# Patient Record
Sex: Female | Born: 1965 | Hispanic: No | Marital: Married | Smoking: Current every day smoker
Health system: Southern US, Community
[De-identification: ages and names within clinical notes are randomized; demographics above are authoritative.]

## PROBLEM LIST (undated history)

## (undated) ENCOUNTER — Emergency Department

## (undated) DIAGNOSIS — I10 Essential (primary) hypertension: Secondary | ICD-10-CM

## (undated) DIAGNOSIS — M069 Rheumatoid arthritis, unspecified: Secondary | ICD-10-CM

## (undated) DIAGNOSIS — K219 Gastro-esophageal reflux disease without esophagitis: Secondary | ICD-10-CM

## (undated) DIAGNOSIS — J449 Chronic obstructive pulmonary disease, unspecified: Secondary | ICD-10-CM

## (undated) DIAGNOSIS — M797 Fibromyalgia: Secondary | ICD-10-CM

## (undated) DIAGNOSIS — F32A Depression, unspecified: Secondary | ICD-10-CM

## (undated) DIAGNOSIS — F419 Anxiety disorder, unspecified: Secondary | ICD-10-CM

## (undated) DIAGNOSIS — B029 Zoster without complications: Secondary | ICD-10-CM

## (undated) DIAGNOSIS — C801 Malignant (primary) neoplasm, unspecified: Secondary | ICD-10-CM

## (undated) HISTORY — DX: Essential (primary) hypertension: I10

## (undated) HISTORY — PX: CHOLECYSTECTOMY: SHX55

## (undated) HISTORY — PX: NASAL SINUS SURGERY: SHX719

## (undated) HISTORY — PX: ADRENALECTOMY: SHX876

## (undated) HISTORY — PX: TUBAL LIGATION: SHX77

## (undated) HISTORY — DX: Zoster without complications: B02.9

## (undated) HISTORY — PX: APPENDECTOMY: SHX54

## (undated) HISTORY — DX: Chronic obstructive pulmonary disease, unspecified: J44.9

## (undated) HISTORY — DX: Anxiety disorder, unspecified: F41.9

## (undated) HISTORY — DX: Gastro-esophageal reflux disease without esophagitis: K21.9

## (undated) HISTORY — DX: Depression, unspecified: F32.A

## (undated) HISTORY — PX: NEPHRECTOMY RADICAL: SUR878

---

## 2004-02-02 ENCOUNTER — Inpatient Hospital Stay (HOSPITAL_COMMUNITY): Admission: AD | Admit: 2004-02-02 | Discharge: 2004-02-06 | Payer: Self-pay | Admitting: Psychiatry

## 2012-10-26 DIAGNOSIS — R630 Anorexia: Secondary | ICD-10-CM | POA: Insufficient documentation

## 2012-10-26 DIAGNOSIS — F32A Depression, unspecified: Secondary | ICD-10-CM | POA: Insufficient documentation

## 2013-06-09 DIAGNOSIS — M25519 Pain in unspecified shoulder: Secondary | ICD-10-CM | POA: Insufficient documentation

## 2013-12-25 DIAGNOSIS — R1013 Epigastric pain: Secondary | ICD-10-CM | POA: Insufficient documentation

## 2013-12-25 DIAGNOSIS — C649 Malignant neoplasm of unspecified kidney, except renal pelvis: Secondary | ICD-10-CM | POA: Insufficient documentation

## 2013-12-25 DIAGNOSIS — F1120 Opioid dependence, uncomplicated: Secondary | ICD-10-CM | POA: Insufficient documentation

## 2017-06-15 DIAGNOSIS — M064 Inflammatory polyarthropathy: Secondary | ICD-10-CM | POA: Insufficient documentation

## 2017-06-15 DIAGNOSIS — M79671 Pain in right foot: Secondary | ICD-10-CM | POA: Insufficient documentation

## 2017-06-15 DIAGNOSIS — R760 Raised antibody titer: Secondary | ICD-10-CM | POA: Insufficient documentation

## 2017-06-15 DIAGNOSIS — Z111 Encounter for screening for respiratory tuberculosis: Secondary | ICD-10-CM

## 2017-06-15 HISTORY — DX: Encounter for screening for respiratory tuberculosis: Z11.1

## 2017-07-15 DIAGNOSIS — M059 Rheumatoid arthritis with rheumatoid factor, unspecified: Secondary | ICD-10-CM | POA: Insufficient documentation

## 2017-10-21 DIAGNOSIS — Z79891 Long term (current) use of opiate analgesic: Secondary | ICD-10-CM | POA: Insufficient documentation

## 2021-05-01 ENCOUNTER — Encounter: Payer: Self-pay | Admitting: Emergency Medicine

## 2021-05-01 ENCOUNTER — Emergency Department: Payer: Medicare Other

## 2021-05-01 ENCOUNTER — Emergency Department
Admission: EM | Admit: 2021-05-01 | Discharge: 2021-05-01 | Disposition: A | Discharge status: Home / Self Care | Payer: Medicare Other | Attending: Emergency Medicine | Admitting: Emergency Medicine

## 2021-05-01 ENCOUNTER — Other Ambulatory Visit: Payer: Self-pay

## 2021-05-01 DIAGNOSIS — Z85528 Personal history of other malignant neoplasm of kidney: Secondary | ICD-10-CM | POA: Insufficient documentation

## 2021-05-01 DIAGNOSIS — F17210 Nicotine dependence, cigarettes, uncomplicated: Secondary | ICD-10-CM | POA: Insufficient documentation

## 2021-05-01 DIAGNOSIS — C801 Malignant (primary) neoplasm, unspecified: Secondary | ICD-10-CM | POA: Insufficient documentation

## 2021-05-01 DIAGNOSIS — R0981 Nasal congestion: Secondary | ICD-10-CM | POA: Insufficient documentation

## 2021-05-01 DIAGNOSIS — R059 Cough, unspecified: Secondary | ICD-10-CM | POA: Diagnosis present

## 2021-05-01 DIAGNOSIS — R079 Chest pain, unspecified: Secondary | ICD-10-CM

## 2021-05-01 DIAGNOSIS — J40 Bronchitis, not specified as acute or chronic: Secondary | ICD-10-CM | POA: Diagnosis not present

## 2021-05-01 HISTORY — DX: Fibromyalgia: M79.7

## 2021-05-01 HISTORY — DX: Malignant (primary) neoplasm, unspecified: C80.1

## 2021-05-01 HISTORY — DX: Rheumatoid arthritis, unspecified: M06.9

## 2021-05-01 LAB — TROPONIN I (HIGH SENSITIVITY)
Troponin I (High Sensitivity): <2 ng/L (ref <18)
Troponin I (High Sensitivity): <2 ng/L (ref <18)

## 2021-05-01 LAB — BASIC METABOLIC PANEL
Anion gap: 7 (ref 5–15)
BUN: 9 mg/dL (ref 6–20)
CO2: 23 mmol/L (ref 22–32)
Calcium: 9.2 mg/dL (ref 8.9–10.3)
Chloride: 104 mmol/L (ref 98–111)
Creatinine, Ser: 0.61 mg/dL (ref 0.44–1.00)
GFR, Estimated: >60 mL/min (ref >60)
Glucose, Bld: 102 mg/dL — ABNORMAL HIGH (ref 70–99)
Potassium: 3.8 mmol/L (ref 3.5–5.1)
Sodium: 134 mmol/L — ABNORMAL LOW (ref 135–145)

## 2021-05-01 LAB — CBC
HCT: 37.5 % (ref 36.0–46.0)
Hemoglobin: 13.1 g/dL (ref 12.0–15.0)
MCH: 31.9 pg (ref 26.0–34.0)
MCHC: 34.9 g/dL (ref 30.0–36.0)
MCV: 91.2 fL (ref 80.0–100.0)
Platelets: 210 10*3/uL (ref 150–400)
RBC: 4.11 MIL/uL (ref 3.87–5.11)
RDW: 12.4 % (ref 11.5–15.5)
WBC: 8.4 10*3/uL (ref 4.0–10.5)
nRBC: 0 % (ref 0.0–0.2)

## 2021-05-01 MED ORDER — AZITHROMYCIN 250 MG PO TABS: ORAL_TABLET | ORAL | 0 refills | Status: AC

## 2021-05-01 NOTE — ED Triage Notes (Signed)
Pt c/o cough with sinus congestion for the past 2 months and since Monday having left sided chest pain that radiates into the left shoulder blade Pt is in NAD on arrival, ambulatory with a steady gait

## 2021-05-01 NOTE — ED Provider Notes (Signed)
[facility] Emergency Department Provider Note  Time seen: 3:14 PM  I have reviewed the triage vital signs and the nursing notes.   HISTORY  Chief Complaint Chest Pain and Cough   HPI Veronica Rangel is a 55 y.o. female with a past medical history of fibromyalgia, rheumatoid arthritis, presents emergency department for chest pain.  According to the patient for the past month or so she has been experiencing frequent cough and sinus congestion.  Patient states a history of sinus congestion including 2 prior sinus surgeries, states this is fairly chronic for her.  Patient states she has been coughing but has been experiencing intermittent chest pain over the past 1 week in the left chest.  Patient called EMS yesterday but states the normal EKG so she decided not to come to the emergency department.  States the chest pain occurred again today so she came to the emergency department for evaluation.  Patient states the pain is mostly when she is moving her left arm or if she coughs or presses on the area.  Denies any fever.  Denies any nausea or diaphoresis.  No leg pain or swelling.   Past Medical History:  Diagnosis Date   Cancer ([place])    renal   Fibromyalgia    RA (rheumatoid arthritis) ([place])     Patient Active Problem List   Diagnosis Date Noted   Cancer ([place]) 05/01/2021    Past Surgical History:  Procedure Laterality Date   ADRENALECTOMY Left    APPENDECTOMY     CESAREAN SECTION     CHOLECYSTECTOMY     NEPHRECTOMY RADICAL      Prior to Admission medications   Not on File    Allergies  Allergen Reactions   Codeine Anaphylaxis    No family history on file.  Social History Social History   Tobacco Use   Smoking status: Every Day    Pack years: 0.00    Types: Cigarettes   Smokeless tobacco: Never  Substance Use Topics   Alcohol use: Not Currently   Drug use: Not Currently    Review of Systems Constitutional: Negative for  fever. Cardiovascular: Left chest pain intermittent x1.5 weeks. Respiratory: Negative for shortness of breath.  Cough x1 month. Gastrointestinal: Negative for abdominal pain, vomiting  Musculoskeletal: Left chest wall pain. Neurological: Negative for headache All other ROS negative  ____________________________________________   PHYSICAL EXAM:  VITAL SIGNS: ED Triage Vitals  Enc Vitals Group     BP 05/01/21 1224 (!) 143/70     Pulse Rate 05/01/21 1224 80     Resp 05/01/21 1224 17     Temp 05/01/21 1224 97.7 F (36.5 C)     Temp Source 05/01/21 1224 Oral     SpO2 05/01/21 1224 100 %     Weight 05/01/21 1225 130 lb (59 kg)     Height 05/01/21 1225 5\' 4"  (1.626 m)     Head Circumference --      Peak Flow --      Pain Score 05/01/21 1225 7     Pain Loc --      Pain Edu? --      Excl. in [place]? --    Constitutional: Alert and oriented. Well appearing and in no distress. Eyes: Normal exam ENT      Head: Normocephalic and atraumatic.      Mouth/Throat: Mucous membranes are moist. Cardiovascular: Normal rate, regular rhythm.  Respiratory: Normal respiratory effort without tachypnea nor retractions. Breath sounds  are clear.  Occasional cough.  Moderate left chest tenderness to palpation. Gastrointestinal: Soft and nontender. No distention.   Musculoskeletal: Nontender with normal range of motion in all extremities. No lower extremity tenderness or edema. Neurologic:  Normal speech and language. No gross focal neurologic deficit Skin:  Skin is warm, dry and intact.  Psychiatric: Mood and affect are normal.   ____________________________________________    EKG  EKG viewed and interpreted by myself shows a normal sinus rhythm 84 bpm with a narrow QRS, normal axis, normal intervals, no concerning ST changes.  ____________________________________________    RADIOLOGY  Chest x-ray is negative for acute abnormality.  ____________________________________________   INITIAL  IMPRESSION / ASSESSMENT AND PLAN / ED COURSE  Pertinent labs & imaging results that were available during my care of the patient were reviewed by me and considered in my medical decision making (see chart for details).   Patient presents emergency department for 1 month of cough and congestion now with 1 week of intermittent left chest pain.  Patient has reproducible chest pain with palpation to the left chest.  Lab work is reassuring including negative troponin x2.  Chest x-ray shows no acute abnormality.  Given the patient's symptoms of cough and congestion I did recommend a COVID test which the patient has declined.  Suspect likely bronchitis leading to chest wall/musculoskeletal discomfort.  We will cover with Zithromax as a precaution have the patient follow-up with her doctor.  Discussed my typical chest pain return precautions.  Veronica Rangel was evaluated in Emergency Department on 05/01/2021 for the symptoms described in the history of present illness. She was evaluated in the context of the global COVID-19 pandemic, which necessitated consideration that the patient might be at risk for infection with the SARS-CoV-2 virus that causes COVID-19. Institutional protocols and algorithms that pertain to the evaluation of patients at risk for COVID-19 are in a state of rapid change based on information released by regulatory bodies including the CDC and federal and state organizations. These policies and algorithms were followed during the patient's care in the ED.  ____________________________________________   FINAL CLINICAL IMPRESSION(S) / ED DIAGNOSES  Chest pain Bronchitis   Harvest Dark, MD 05/01/21 1517

## 2022-03-08 ENCOUNTER — Other Ambulatory Visit: Payer: Self-pay

## 2022-03-08 ENCOUNTER — Encounter: Payer: Self-pay | Admitting: *Deleted

## 2022-03-08 ENCOUNTER — Emergency Department: Payer: Medicare Other

## 2022-03-08 DIAGNOSIS — F419 Anxiety disorder, unspecified: Secondary | ICD-10-CM | POA: Diagnosis not present

## 2022-03-08 DIAGNOSIS — J9801 Acute bronchospasm: Secondary | ICD-10-CM | POA: Insufficient documentation

## 2022-03-08 DIAGNOSIS — Z720 Tobacco use: Secondary | ICD-10-CM | POA: Insufficient documentation

## 2022-03-08 DIAGNOSIS — Z85528 Personal history of other malignant neoplasm of kidney: Secondary | ICD-10-CM | POA: Insufficient documentation

## 2022-03-08 DIAGNOSIS — R0602 Shortness of breath: Secondary | ICD-10-CM | POA: Diagnosis present

## 2022-03-08 LAB — BASIC METABOLIC PANEL
Anion gap: 7 (ref 5–15)
BUN: 16 mg/dL (ref 6–20)
CO2: 25 mmol/L (ref 22–32)
Calcium: 8.8 mg/dL — ABNORMAL LOW (ref 8.9–10.3)
Chloride: 108 mmol/L (ref 98–111)
Creatinine, Ser: 0.63 mg/dL (ref 0.44–1.00)
GFR, Estimated: >60 mL/min (ref >60)
Glucose, Bld: 117 mg/dL — ABNORMAL HIGH (ref 70–99)
Potassium: 4 mmol/L (ref 3.5–5.1)
Sodium: 140 mmol/L (ref 135–145)

## 2022-03-08 LAB — CBC
HCT: 39.3 % (ref 36.0–46.0)
Hemoglobin: 12.7 g/dL (ref 12.0–15.0)
MCH: 28.2 pg (ref 26.0–34.0)
MCHC: 32.3 g/dL (ref 30.0–36.0)
MCV: 87.3 fL (ref 80.0–100.0)
Platelets: 273 10*3/uL (ref 150–400)
RBC: 4.5 MIL/uL (ref 3.87–5.11)
RDW: 12.3 % (ref 11.5–15.5)
WBC: 8 10*3/uL (ref 4.0–10.5)
nRBC: 0 % (ref 0.0–0.2)

## 2022-03-08 LAB — TROPONIN I (HIGH SENSITIVITY): Troponin I (High Sensitivity): 4 ng/L (ref <18)

## 2022-03-08 NOTE — ED Triage Notes (Signed)
Pt reports feeling sob, tightness in chest, wheezing and panic attacks for 1 week.  Pt states I can't relax.  Taking meds without relief for panic attacks.  Pt alert  speech clear ?

## 2022-03-09 ENCOUNTER — Emergency Department
Admission: EM | Admit: 2022-03-09 | Discharge: 2022-03-09 | Disposition: A | Discharge status: Home / Self Care | Payer: Medicare Other | Attending: Emergency Medicine | Admitting: Emergency Medicine

## 2022-03-09 DIAGNOSIS — F419 Anxiety disorder, unspecified: Secondary | ICD-10-CM

## 2022-03-09 DIAGNOSIS — J9801 Acute bronchospasm: Secondary | ICD-10-CM

## 2022-03-09 LAB — TROPONIN I (HIGH SENSITIVITY): Troponin I (High Sensitivity): 5 ng/L (ref <18)

## 2022-03-09 MED ORDER — HYDROXYZINE PAMOATE 25 MG PO CAPS: 25.0000 mg | ORAL_CAPSULE | Freq: Three times a day (TID) | ORAL | 1 refills | Status: DC | PRN

## 2022-03-09 MED ORDER — ALBUTEROL SULFATE HFA 108 (90 BASE) MCG/ACT IN AERS: 2.0000 | INHALATION_SPRAY | RESPIRATORY_TRACT | 1 refills | Status: DC | PRN

## 2022-03-09 MED ORDER — PREDNISONE 10 MG PO TABS: 60.0000 mg | ORAL_TABLET | Freq: Every day | ORAL | 0 refills | Status: AC

## 2022-03-09 NOTE — ED Provider Notes (Signed)
? ?[facility] ?Provider Note ? ? ? Event Date/Time  ? First MD Initiated Contact with Patient 03/09/22 0449   ?  (approximate) ? ? ?History  ? ?Shortness of Breath ? ? ?HPI ? ?Veronica Rangel is a 56 y.o. female with history of fibromyalgia, rheumatoid arthritis, depression and OCD and anxiety who presents to the emergency department with complaints of wheezing and shortness of breath that have been intermittent over the past week.  She states when she starts to wheeze she becomes more anxious and this causes her to have more shortness of breath and chest discomfort.  Dates she was previously on Effexor for her mental illness but was switched to Cymbalta as her psychiatrist thought that this might help with her fibromyalgia pain.  She states she used to be on Klonopin about 2 years ago but is not able to get this medication through her psychiatrist as they are tele visits.  She denies any SI or HI.  No fevers, cough.  No history of PE, DVT, exogenous estrogen use, recent fractures, surgery, trauma, hospitalization, prolonged travel or other immobilization. No lower extremity swelling or pain. No calf tenderness.  States she thinks she has previously taken Vistaril which helped with her anxiety. ? ?History provided by patient. ? ? ? ?Past Medical History:  ?Diagnosis Date  ? Cancer [facility])   ? renal  ? Fibromyalgia   ? RA (rheumatoid arthritis) ([place])   ? ? ?Past Surgical History:  ?Procedure Laterality Date  ? ADRENALECTOMY Left   ? APPENDECTOMY    ? CESAREAN SECTION    ? CHOLECYSTECTOMY    ? NEPHRECTOMY RADICAL    ? ? ?MEDICATIONS:  ?Prior to Admission medications   ?Not on File  ? ? ?Physical Exam  ? ?Triage Vital Signs: ?ED Triage Vitals  ?Enc Vitals Group  ?   BP 03/08/22 2128 (!) 141/88  ?   Pulse Rate 03/08/22 2128 (!) 110  ?   Resp 03/08/22 2128 (!) 24  ?   Temp 03/08/22 2128 97.8 ?F (36.6 ?C)  ?   Temp Source 03/08/22 2128 Oral  ?   SpO2 03/08/22 2128 97 %  ?   Weight 03/08/22 2129 175 lb  (79.4 kg)  ?   Height 03/08/22 2129 '5\' 4"'$  (1.626 m)  ?   Head Circumference --   ?   Peak Flow --   ?   Pain Score 03/08/22 2129 7  ?   Pain Loc --   ?   Pain Edu? --   ?   Excl. in [place]? --   ? ? ?Most recent vital signs: ?Vitals:  ? 03/08/22 2128 03/09/22 0122  ?BP: (!) 141/88 (!) 168/76  ?Pulse: (!) 110 77  ?Resp: (!) 24 18  ?Temp: 97.8 ?F (36.6 ?C) 98 ?F (36.7 ?C)  ?SpO2: 97% 100%  ? ? ?CONSTITUTIONAL: Alert and oriented and responds appropriately to questions. Well-appearing; well-nourished ?HEAD: Normocephalic, atraumatic ?EYES: Conjunctivae clear, pupils appear equal, sclera nonicteric ?ENT: normal nose; moist mucous membranes ?NECK: Supple, normal ROM ?CARD: RRR; S1 and S2 appreciated; no murmurs, no clicks, no rubs, no gallops ?RESP: Normal chest excursion without splinting or tachypnea; breath sounds clear and equal bilaterally; no wheezes, no rhonchi, no rales, no hypoxia or respiratory distress, speaking full sentences ?ABD/GI: Normal bowel sounds; non-distended; soft, non-tender, no rebound, no guarding, no peritoneal signs ?BACK: The back appears normal ?EXT: Normal ROM in all joints; no deformity noted, no edema; no cyanosis, no calf  tenderness or calf swelling. ?SKIN: Normal color for age and race; warm; no rash on exposed skin ?NEURO: Moves all extremities equally, normal speech ?PSYCH: The patient's mood and manner are appropriate.  No SI or HI. ? ? ?ED Results / Procedures / Treatments  ? ?LABS: ?(all labs ordered are listed, but only abnormal results are displayed) ?Labs Reviewed  ?BASIC METABOLIC PANEL - Abnormal; Notable for the following components:  ?    Result Value  ? Glucose, Bld 117 (*)   ? Calcium 8.8 (*)   ? All other components within normal limits  ?CBC  ?TROPONIN I (HIGH SENSITIVITY)  ?TROPONIN I (HIGH SENSITIVITY)  ? ? ? ?EKG: ? EKG Interpretation ? ?Date/Time:  Monday March 08 2022 21:37:05 EDT ?Ventricular Rate:  92 ?PR Interval:  148 ?QRS Duration: 76 ?QT Interval:  350 ?QTC  Calculation: 432 ?R Axis:   25 ?Text Interpretation: Normal sinus rhythm Cannot rule out Anterior infarct , age undetermined Abnormal ECG When compared with ECG of 01-May-2021 12:18, No significant change was found Confirmed by Pryor Curia [phone]) on 03/09/2022 2:35:55 AM ?  ? ?  ? ? ? ?RADIOLOGY: ?My personal review and interpretation of imaging: Chest x-ray clear. ? ?I have personally reviewed all radiology reports.   ?DG Chest 2 View ? ?Result Date: 03/08/2022 ?CLINICAL DATA:  Shortness of breath. EXAM: CHEST - 2 VIEW COMPARISON:  Chest radiograph dated 05/01/2021. FINDINGS: No focal consolidation, pleural effusion or pneumothorax. The cardiac silhouette is within limits. No acute osseous pathology. IMPRESSION: No active cardiopulmonary disease. Electronically Signed   By: Anner Crete M.D.   On: 03/08/2022 22:25   ? ? ?PROCEDURES: ? ?Critical Care performed: No ? ? ? ? ? ?Procedures ? ? ? ?IMPRESSION / MDM / ASSESSMENT AND PLAN / ED COURSE  ?I reviewed the triage vital signs and the nursing notes. ? ? ? ?Patient here with complaints of chest pain, shortness of breath, anxiety, wheezing.  History of tobacco use but no known history of asthma or COPD. ? ? ? ?DIFFERENTIAL DIAGNOSIS (includes but not limited to):   Anxiety, panic attacks, ACS, PE, dissection, COPD, asthma, bronchospasm, CHF, pneumonia ? ? ?PLAN: We will obtain CBC, BMP, troponin x2, chest x-ray.  Lungs currently clear to auscultation with no wheezing.  EKG nonischemic.  No SI or HI.  I do not feel she needs emergent psychiatric evaluation. ? ? ?MEDICATIONS GIVEN IN ED: ?Medications - No data to display ? ? ?ED COURSE: Patient's labs are reassuring.  Normal hemoglobin, electrolytes, troponin x2 negative.  Chest x-ray reviewed by myself and radiologist and shows no infiltrate or edema.  Lungs are clear to auscultation.  Hemodynamically stable.  No SI, HI.  Does not seem to be in distress.  Will discharge with a prescription of Vistaril and have  encouraged her to follow-up with her psychiatrist to see if she would benefit from switching back to Effexor as she feels this helped with her symptoms previously.  Discussed with her if she feels she needs a prescription for benzodiazepines that this would need to be filled by an outpatient provider.  Have given her outpatient resources.  We will also give her prescription for albuterol inhaler and prednisone burst given her intermittent wheezing in the setting of tobacco use.  Have encouraged her to quit smoking. ? ? ?At this time, I do not feel there is any life-threatening condition present. I reviewed all nursing notes, vitals, pertinent previous records.  All lab and urine results,  EKGs, imaging ordered have been independently reviewed and interpreted by myself.  I reviewed all available radiology reports from any imaging ordered this visit.  Based on my assessment, I feel the patient is safe to be discharged home without further emergent workup and can continue workup as an outpatient as needed. Discussed all findings, treatment plan as well as usual and customary return precautions with patient.  They verbalize understanding and are comfortable with this plan.  Outpatient follow-up has been provided as needed.  All questions have been answered. ? ? ? ?CONSULTS: Patient does have some risk factors for ACS including age, obesity but symptoms seem atypical and she has had 2 negative troponins here therefore do not feel admission is required at this time for chest pain rule out. ? ? ?OUTSIDE RECORDS REVIEWED: Reviewed patient's last office visit with Darrick Huntsman on 05/24/2017. ? ? ? ? ? ? ? ? ?FINAL CLINICAL IMPRESSION(S) / ED DIAGNOSES  ? ?Final diagnoses:  ?Bronchospasm  ?Anxiety  ? ? ? ?Rx / DC Orders  ? ?ED Discharge Orders   ? ?      Ordered  ?  albuterol (VENTOLIN HFA) 108 (90 Base) MCG/ACT inhaler  Every 4 hours PRN       ? 03/09/22 0500  ?  predniSONE (DELTASONE) 10 MG tablet  Daily       ? 03/09/22 0500   ?  hydrOXYzine (VISTARIL) 25 MG capsule  3 times daily PRN       ? 03/09/22 0500  ? ?  ?  ? ?  ? ? ? ?Note:  This document was prepared using Dragon voice recognition software and may include unintentional dic

## 2022-03-09 NOTE — Discharge Instructions (Signed)
Steps to find a Primary Care Provider (PCP):  Call [phone] or [phone] to access "[facility] Find a Doctor Service."  2.  You may also go on the [facility] website at [email or web address]  

## 2022-03-29 ENCOUNTER — Emergency Department
Admission: EM | Admit: 2022-03-29 | Discharge: 2022-03-29 | Disposition: A | Discharge status: Home / Self Care | Payer: Medicare Other | Attending: Emergency Medicine | Admitting: Emergency Medicine

## 2022-03-29 ENCOUNTER — Emergency Department: Payer: Medicare Other

## 2022-03-29 ENCOUNTER — Other Ambulatory Visit: Payer: Self-pay

## 2022-03-29 DIAGNOSIS — M25519 Pain in unspecified shoulder: Secondary | ICD-10-CM | POA: Diagnosis not present

## 2022-03-29 DIAGNOSIS — R0602 Shortness of breath: Secondary | ICD-10-CM | POA: Diagnosis present

## 2022-03-29 DIAGNOSIS — J441 Chronic obstructive pulmonary disease with (acute) exacerbation: Secondary | ICD-10-CM | POA: Insufficient documentation

## 2022-03-29 DIAGNOSIS — R202 Paresthesia of skin: Secondary | ICD-10-CM | POA: Insufficient documentation

## 2022-03-29 LAB — CBC
HCT: 41.2 % (ref 36.0–46.0)
Hemoglobin: 13.7 g/dL (ref 12.0–15.0)
MCH: 28.1 pg (ref 26.0–34.0)
MCHC: 33.3 g/dL (ref 30.0–36.0)
MCV: 84.6 fL (ref 80.0–100.0)
Platelets: 296 10*3/uL (ref 150–400)
RBC: 4.87 MIL/uL (ref 3.87–5.11)
RDW: 12.7 % (ref 11.5–15.5)
WBC: 10.2 10*3/uL (ref 4.0–10.5)
nRBC: 0 % (ref 0.0–0.2)

## 2022-03-29 LAB — TROPONIN I (HIGH SENSITIVITY): Troponin I (High Sensitivity): 3 ng/L (ref <18)

## 2022-03-29 LAB — BASIC METABOLIC PANEL
Anion gap: 7 (ref 5–15)
BUN: 14 mg/dL (ref 6–20)
CO2: 27 mmol/L (ref 22–32)
Calcium: 9.3 mg/dL (ref 8.9–10.3)
Chloride: 103 mmol/L (ref 98–111)
Creatinine, Ser: 0.59 mg/dL (ref 0.44–1.00)
GFR, Estimated: >60 mL/min (ref >60)
Glucose, Bld: 98 mg/dL (ref 70–99)
Potassium: 4.1 mmol/L (ref 3.5–5.1)
Sodium: 137 mmol/L (ref 135–145)

## 2022-03-29 MED ORDER — IPRATROPIUM-ALBUTEROL 0.5-2.5 (3) MG/3ML IN SOLN
3.0000 mL | Freq: Once | RESPIRATORY_TRACT | Status: AC
  Administered 2022-03-29: 3 mL via RESPIRATORY_TRACT
  Filled 2022-03-29: qty 3

## 2022-03-29 NOTE — ED Triage Notes (Addendum)
Reports right shoulder pain, SOB and palpitations. Thinks she might be having side effects of changing mediations. Pt was switched to Effexor from Cymbalta recently. She reports she feels anxious. ?

## 2022-03-29 NOTE — ED Notes (Signed)
Patient discharged to home per MD order. Patient in stable condition, and deemed medically cleared by ED provider for discharge. Discharge instructions reviewed with patient/family using "Teach Back"; verbalized understanding of medication education and administration, and information about follow-up care. Denies further concerns. ° °

## 2022-03-29 NOTE — ED Provider Notes (Signed)
? ?[facility] ?Provider Note ? ? ? Event Date/Time  ? First MD Initiated Contact with Patient 03/29/22 1042   ?  (approximate) ? ? ?History  ? ?Shortness of Breath and Shoulder Pain ? ? ?HPI ? ?Veronica Rangel is a 56 y.o. female who presents to the ED for evaluation of Shortness of Breath and Shoulder Pain ?  ?Patient presents to the ED for evaluation of shortness of breath, bilateral leg tingling, anxiety and concerns for medication side effect.  She reports her telepsychiatrist recently switched her SNRIs from Cymbalta to Effexor and concerned her symptoms may be related to this.  Reports that she was seen in the ED a few weeks ago and given a course of steroids for her lungs, but she never took them at home.  Reports that she does not like to take steroids as it worsens her anxiety. ? ?Reports having a tele medicine visit with her psychiatrist this afternoon at 1230 and really wants to make that appointment.  Reports that she has steroids at home from her last ED visit when she was diagnosed with bronchospasm, reports that she does not like to take prednisone and probably would not take steroids if I prescribe them to her. ? ?Physical Exam  ? ?Triage Vital Signs: ?ED Triage Vitals  ?Enc Vitals Group  ?   BP 03/29/22 1023 (!) 153/98  ?   Pulse Rate 03/29/22 1023 86  ?   Resp 03/29/22 1023 16  ?   Temp 03/29/22 1023 98.1 ?F (36.7 ?C)  ?   Temp Source 03/29/22 1023 Oral  ?   SpO2 03/29/22 1023 96 %  ?   Weight 03/29/22 1027 174 lb 2.6 oz (79 kg)  ?   Height 03/29/22 1027 '5\' 4"'$  (1.626 m)  ?   Head Circumference --   ?   Peak Flow --   ?   Pain Score 03/29/22 1027 8  ?   Pain Loc --   ?   Pain Edu? --   ?   Excl. in [place]? --   ? ? ?Most recent vital signs: ?Vitals:  ? 03/29/22 1023  ?BP: (!) 153/98  ?Pulse: 86  ?Resp: 16  ?Temp: 98.1 ?F (36.7 ?C)  ?SpO2: 96%  ? ? ?General: Awake, no distress.  ?CV:  Good peripheral perfusion. RRR ?Resp:  Normal effort.  Diffuse expiratory wheezing without focal  features. ?Abd:  No distention.  ?MSK:  No deformity noted.  ?Neuro:  No focal deficits appreciated. Cranial nerves II through XII intact ?5/5 strength and sensation in all 4 extremities ?Other:   ? ? ?ED Results / Procedures / Treatments  ? ?Labs ?(all labs ordered are listed, but only abnormal results are displayed) ?Labs Reviewed  ?BASIC METABOLIC PANEL  ?CBC  ?TROPONIN I (HIGH SENSITIVITY)  ?TROPONIN I (HIGH SENSITIVITY)  ? ? ?EKG ?Sinus rhythm, rate of 80 bpm.  Normal axis and intervals.  No evidence of acute ischemia. ? ?RADIOLOGY ?CXR reviewed by me without evidence of acute cardiopulmonary pathology. ? ?Official radiology report(s): ?DG Chest 2 View ? ?Result Date: 03/29/2022 ?CLINICAL DATA:  Provided history: Shortness of breath. EXAM: CHEST - 2 VIEW COMPARISON:  Prior chest radiographs 03/08/2022 and earlier. FINDINGS: Heart size within normal limits. No appreciable airspace consolidation. No evidence of pleural effusion or pneumothorax. No acute bony abnormality identified. Thoracic dextrocurvature. Redemonstrated chronic left-sided rib fracture deformities. Surgical clips within the upper abdomen. IMPRESSION: No evidence of acute cardiopulmonary abnormality. Electronically Signed  By: Kellie Simmering D.O.   On: 03/29/2022 10:59   ? ?PROCEDURES and INTERVENTIONS: ? ?.1-3 Lead EKG Interpretation ?Performed by: Vladimir Crofts, MD ?Authorized by: Vladimir Crofts, MD  ? ?  Interpretation: normal   ?  ECG rate:  80 ? ?Medications  ?ipratropium-albuterol (DUONEB) 0.5-2.5 (3) MG/3ML nebulizer solution 3 mL (3 mLs Nebulization Given 03/29/22 1143)  ? ? ? ?IMPRESSION / MDM / ASSESSMENT AND PLAN / ED COURSE  ?I reviewed the triage vital signs and the nursing notes. ? ?56 year old female presents to the ED with various symptoms, with evidence of a COPD exacerbation suitable for outpatient management.  Look systemically well.  Has evidence of mild COPD/patient on exam with wheezing throughout and slight decreased airflow.   Improved with breathing treatment.  CXR is clear without infiltrate or PTX.  Troponin is low and EKG is nonischemic.  Metabolic panel and CBC are normal.  No barriers to outpatient management.  We will discharge with return precautions.  She refuses my offer and recommendation for steroids. ? ?  ? ? ?FINAL CLINICAL IMPRESSION(S) / ED DIAGNOSES  ? ?Final diagnoses:  ?COPD exacerbation ([place])  ? ? ? ?Rx / DC Orders  ? ?ED Discharge Orders   ? ? None  ? ?  ? ? ? ?Note:  This document was prepared using Dragon voice recognition software and may include unintentional dictation errors. ?  ?Vladimir Crofts, MD ?03/29/22 1231 ? ?

## 2022-04-02 ENCOUNTER — Other Ambulatory Visit: Payer: Self-pay

## 2022-04-02 ENCOUNTER — Encounter: Payer: Self-pay | Admitting: Emergency Medicine

## 2022-04-02 ENCOUNTER — Emergency Department: Payer: Medicare Other

## 2022-04-02 ENCOUNTER — Emergency Department
Admission: EM | Admit: 2022-04-02 | Discharge: 2022-04-02 | Disposition: A | Discharge status: Home / Self Care | Payer: Medicare Other | Attending: Emergency Medicine | Admitting: Emergency Medicine

## 2022-04-02 DIAGNOSIS — F419 Anxiety disorder, unspecified: Secondary | ICD-10-CM | POA: Insufficient documentation

## 2022-04-02 DIAGNOSIS — R0602 Shortness of breath: Secondary | ICD-10-CM

## 2022-04-02 LAB — CBC WITH DIFFERENTIAL/PLATELET
Abs Immature Granulocytes: 0.03 10*3/uL (ref 0.00–0.07)
Basophils Absolute: 0 10*3/uL (ref 0.0–0.1)
Basophils Relative: 0 %
Eosinophils Absolute: 0 10*3/uL (ref 0.0–0.5)
Eosinophils Relative: 0 %
HCT: 42.1 % (ref 36.0–46.0)
Hemoglobin: 14.1 g/dL (ref 12.0–15.0)
Immature Granulocytes: 0 %
Lymphocytes Relative: 26 %
Lymphs Abs: 2.2 10*3/uL (ref 0.7–4.0)
MCH: 27.7 pg (ref 26.0–34.0)
MCHC: 33.5 g/dL (ref 30.0–36.0)
MCV: 82.7 fL (ref 80.0–100.0)
Monocytes Absolute: 0.7 10*3/uL (ref 0.1–1.0)
Monocytes Relative: 8 %
Neutro Abs: 5.6 10*3/uL (ref 1.7–7.7)
Neutrophils Relative %: 66 %
Platelets: 292 10*3/uL (ref 150–400)
RBC: 5.09 MIL/uL (ref 3.87–5.11)
RDW: 12.4 % (ref 11.5–15.5)
WBC: 8.5 10*3/uL (ref 4.0–10.5)
nRBC: 0 % (ref 0.0–0.2)

## 2022-04-02 LAB — BASIC METABOLIC PANEL
Anion gap: 11 (ref 5–15)
BUN: 9 mg/dL (ref 6–20)
CO2: 25 mmol/L (ref 22–32)
Calcium: 9.5 mg/dL (ref 8.9–10.3)
Chloride: 100 mmol/L (ref 98–111)
Creatinine, Ser: 0.57 mg/dL (ref 0.44–1.00)
GFR, Estimated: >60 mL/min (ref >60)
Glucose, Bld: 103 mg/dL — ABNORMAL HIGH (ref 70–99)
Potassium: 3.9 mmol/L (ref 3.5–5.1)
Sodium: 136 mmol/L (ref 135–145)

## 2022-04-02 LAB — TROPONIN I (HIGH SENSITIVITY): Troponin I (High Sensitivity): 3 ng/L (ref <18)

## 2022-04-02 MED ORDER — DEXAMETHASONE SODIUM PHOSPHATE 10 MG/ML IJ SOLN
10.0000 mg | Freq: Once | INTRAMUSCULAR | Status: AC
  Administered 2022-04-02: 10 mg via INTRAMUSCULAR
  Filled 2022-04-02: qty 1

## 2022-04-02 MED ORDER — LORAZEPAM 0.5 MG PO TABS: 0.5000 mg | ORAL_TABLET | Freq: Three times a day (TID) | ORAL | 0 refills | Status: DC | PRN

## 2022-04-02 MED ORDER — IPRATROPIUM-ALBUTEROL 0.5-2.5 (3) MG/3ML IN SOLN
3.0000 mL | Freq: Once | RESPIRATORY_TRACT | Status: AC
Start: 2022-04-02 — End: 2022-04-02
  Administered 2022-04-02: 3 mL via RESPIRATORY_TRACT
  Filled 2022-04-02: qty 3

## 2022-04-02 MED ORDER — LORAZEPAM 1 MG PO TABS
1.0000 mg | ORAL_TABLET | Freq: Once | ORAL | Status: AC
  Administered 2022-04-02: 1 mg via ORAL
  Filled 2022-04-02: qty 1

## 2022-04-02 NOTE — ED Notes (Signed)
Pt stating they feel uneasy and are shaking. Pt informed that the medication can cause their current symptoms. Pt given remote and warm blanket, pt resting in bed denies any other needs at this time. ?

## 2022-04-02 NOTE — ED Triage Notes (Addendum)
Pt presents to ED with c/o sob and dizziness. Pt states she was seen in this ED a few days ago and given a breathing tx and told she has COPD and states her symptoms have no improved since then. Slight increased work of breathing noted at this time with occasional audible wheezing present. Skin warm and dry. Pt tearful in triage and reports having anxiety and states she can't relax. Pt states she used her inhaler last night but reports it didn't work and did not use it this morning because it makes her feel more anxious.  ?

## 2022-04-02 NOTE — ED Notes (Signed)
Pt provides verbal consent for dc. Ppw provided. Follow up reviewed and pt quesrions answered. Pt to lobby in wheelchair alert and oriented x4. ?

## 2022-04-02 NOTE — ED Notes (Addendum)
RN to bedside to introduce self to pt. Pt is very distraught and feels very "anxious". Pt has been seeing a psych doctor online. Pt has HX of panic attacks. Pt states "I feel like something bad is going to happen to me. I havent slept in days. I am worried I am going to die".  ? ?MD at bedside.  ?

## 2022-04-05 NOTE — ED Provider Notes (Signed)
? ?[facility] ?Provider Note ? ? ? Event Date/Time  ? First MD Initiated Contact with Patient 04/02/22 1025   ?  (approximate) ? ? ?History  ? ?Shortness of Breath ? ? ?HPI ? ?Veronica Rangel is a 56 y.o. female with a history of fibromyalgia, rheumatoid arthritis who presents with complaints of shortness of breath.  Patient thinks that her shortness of breath may be related to anxiety, she reports increased work of breathing because she becomes very scared.  Denies fevers chills.  Denies chest pain.  No calf pain or swelling ?  ? ? ?Physical Exam  ? ?Triage Vital Signs: ?ED Triage Vitals  ?Enc Vitals Group  ?   BP 04/02/22 0927 (!) 148/82  ?   Pulse Rate 04/02/22 0927 95  ?   Resp 04/02/22 0927 17  ?   Temp 04/02/22 0927 98.7 ?F (37.1 ?C)  ?   Temp Source 04/02/22 0927 Oral  ?   SpO2 04/02/22 0927 97 %  ?   Weight 04/02/22 0933 81.6 kg (180 lb)  ?   Height 04/02/22 0933 1.626 m ('5\' 4"'$ )  ?   Head Circumference --   ?   Peak Flow --   ?   Pain Score 04/02/22 0933 0  ?   Pain Loc --   ?   Pain Edu? --   ?   Excl. in [place]? --   ? ? ?Most recent vital signs: ?Vitals:  ? 04/02/22 1130 04/02/22 1200  ?BP: 135/75 137/68  ?Pulse: 88 93  ?Resp: 16 11  ?Temp:    ?SpO2: 99% 99%  ? ? ? ?General: Awake, no distress.  ?CV:  Good peripheral perfusion.  ?Resp:  Normal effort.  Scattered wheezing on exam ?Abd:  No distention.  ?Other:   ? ? ?ED Results / Procedures / Treatments  ? ?Labs ?(all labs ordered are listed, but only abnormal results are displayed) ?Labs Reviewed  ?BASIC METABOLIC PANEL - Abnormal; Notable for the following components:  ?    Result Value  ? Glucose, Bld 103 (*)   ? All other components within normal limits  ?CBC WITH DIFFERENTIAL/PLATELET  ?TROPONIN I (HIGH SENSITIVITY)  ? ? ? ?EKG ? ?ED ECG REPORT ?I, Lavonia Drafts, the attending physician, personally viewed and interpreted this ECG. ? ?Date: 04/05/2022 ? ?Rhythm: normal sinus rhythm ?QRS Axis: normal ?Intervals: normal ?ST/T Wave  abnormalities: normal ?Narrative Interpretation: no evidence of acute ischemia ? ? ? ?RADIOLOGY ?Chest x-ray interpreted by me, no acute abnormality ? ? ? ?PROCEDURES: ? ?Critical Care performed:  ? ?Procedures ? ? ?MEDICATIONS ORDERED IN ED: ?Medications  ?LORazepam (ATIVAN) tablet 1 mg (1 mg Oral Given 04/02/22 1051)  ?dexamethasone (DECADRON) injection 10 mg (10 mg Intramuscular Given 04/02/22 1053)  ?ipratropium-albuterol (DUONEB) 0.5-2.5 (3) MG/3ML nebulizer solution 3 mL (3 mLs Nebulization Given 04/02/22 1052)  ?ipratropium-albuterol (DUONEB) 0.5-2.5 (3) MG/3ML nebulizer solution 3 mL (3 mLs Nebulization Given 04/02/22 1052)  ? ? ? ?IMPRESSION / MDM / ASSESSMENT AND PLAN / ED COURSE  ?I reviewed the triage vital signs and the nursing notes. ? ?Patient presents with shortness of breath as above. ? ?Differential includes bronchospasm/COPD, anxiety, pneumonia ? ?Chest x-ray is reassuring, lab work demonstrates normal white blood cell count, normal high sensitive troponin, normal BNP ? ?She does have scattered wheezing on exam, treated with Decadron, DuoNebs with significant improvement in her symptoms. ? ?Considered admission however after treatment she is feeling much better, on reexam wheezing is  much diminished ? ?Appropriate for discharge at this time, return precautions discussed ? ? ? ? ? ?  ? ? ?FINAL CLINICAL IMPRESSION(S) / ED DIAGNOSES  ? ?Final diagnoses:  ?SOB (shortness of breath)  ?Anxiety  ? ? ? ?Rx / DC Orders  ? ?ED Discharge Orders   ? ?      Ordered  ?  LORazepam (ATIVAN) 0.5 MG tablet  Every 8 hours PRN       ? 04/02/22 1156  ? ?  ?  ? ?  ? ? ? ?Note:  This document was prepared using Dragon voice recognition software and may include unintentional dictation errors. ?  ?Lavonia Drafts, MD ?04/05/22 1451 ? ?

## 2022-04-11 ENCOUNTER — Emergency Department: Payer: Medicare Other

## 2022-04-11 ENCOUNTER — Emergency Department
Admission: EM | Admit: 2022-04-11 | Discharge: 2022-04-11 | Disposition: A | Discharge status: Home / Self Care | Payer: Medicare Other | Attending: Emergency Medicine | Admitting: Emergency Medicine

## 2022-04-11 ENCOUNTER — Other Ambulatory Visit: Payer: Self-pay

## 2022-04-11 DIAGNOSIS — F410 Panic disorder [episodic paroxysmal anxiety] without agoraphobia: Secondary | ICD-10-CM | POA: Diagnosis not present

## 2022-04-11 DIAGNOSIS — R0600 Dyspnea, unspecified: Secondary | ICD-10-CM | POA: Diagnosis not present

## 2022-04-11 DIAGNOSIS — R0602 Shortness of breath: Secondary | ICD-10-CM | POA: Diagnosis present

## 2022-04-11 LAB — BASIC METABOLIC PANEL
Anion gap: 10 (ref 5–15)
BUN: 13 mg/dL (ref 6–20)
CO2: 23 mmol/L (ref 22–32)
Calcium: 9.2 mg/dL (ref 8.9–10.3)
Chloride: 101 mmol/L (ref 98–111)
Creatinine, Ser: 0.61 mg/dL (ref 0.44–1.00)
GFR, Estimated: >60 mL/min (ref >60)
Glucose, Bld: 109 mg/dL — ABNORMAL HIGH (ref 70–99)
Potassium: 4.4 mmol/L (ref 3.5–5.1)
Sodium: 134 mmol/L — ABNORMAL LOW (ref 135–145)

## 2022-04-11 LAB — CBC
HCT: 43.3 % (ref 36.0–46.0)
Hemoglobin: 14.3 g/dL (ref 12.0–15.0)
MCH: 27.4 pg (ref 26.0–34.0)
MCHC: 33 g/dL (ref 30.0–36.0)
MCV: 83.1 fL (ref 80.0–100.0)
Platelets: 306 10*3/uL (ref 150–400)
RBC: 5.21 MIL/uL — ABNORMAL HIGH (ref 3.87–5.11)
RDW: 12.7 % (ref 11.5–15.5)
WBC: 10 10*3/uL (ref 4.0–10.5)
nRBC: 0 % (ref 0.0–0.2)

## 2022-04-11 LAB — D-DIMER, QUANTITATIVE: D-Dimer, Quant: 2.76 ug/mL-FEU — ABNORMAL HIGH (ref 0.00–0.50)

## 2022-04-11 MED ORDER — IOHEXOL 350 MG/ML SOLN
75.0000 mL | Freq: Once | INTRAVENOUS | Status: AC | PRN
  Administered 2022-04-11: 75 mL via INTRAVENOUS

## 2022-04-11 MED ORDER — LORAZEPAM 1 MG PO TABS
1.0000 mg | ORAL_TABLET | Freq: Once | ORAL | Status: AC
  Administered 2022-04-11: 1 mg via ORAL
  Filled 2022-04-11: qty 1

## 2022-04-11 MED ORDER — LORAZEPAM 0.5 MG PO TABS: 0.5000 mg | ORAL_TABLET | Freq: Every day | ORAL | 0 refills | Status: AC

## 2022-04-11 MED ORDER — SODIUM CHLORIDE 0.9 % IV BOLUS
500.0000 mL | Freq: Once | INTRAVENOUS | Status: AC
  Administered 2022-04-11: 500 mL via INTRAVENOUS

## 2022-04-11 NOTE — ED Notes (Signed)
Pt ambulated to toilet and back to bed with no difficulty and no SOB.

## 2022-04-11 NOTE — ED Notes (Signed)
RN to bedside to introduce self to pt. RN is familiar with pt. MD also at bedside.

## 2022-04-11 NOTE — ED Provider Notes (Signed)
[facility] Provider Note    Event Date/Time   First MD Initiated Contact with Patient 04/11/22 [phone]     (approximate)   History   Chest Pain and Dizziness   HPI  Veronica Rangel is a 56 y.o. female  history of fibromyalgia, rheumatoid arthritis, depression and OCD and anxiety (from prior note 03-09-2022).  Also reviewed recent ED visit from March 12 by Dr. Corky Downs  Patient reports her shortness of breath got better after she was treated with Ativan.  He has been struggling with disabling level of anxiety.  She reports that she has an appointment coming up with a primary care doctor on Thursday this week in [place].  She follows with telepsychiatry and they recently changed her from Cymbalta back to Effexor which she has used in the past.  She reports that for the last week she has been taking Ativan 3 times a day has been helpful but she ran out about 3 days ago.  Since then her symptoms of panic have come back.  She feels very anxious all the time and cannot sleep well, and is constantly concerned that she might die as she was told she might have COPD on ED visit a month ago  She reports she sits up at night thinking about how she might die from COPD.  She actually reports she feels a little short of breath but does not feel like she is wheezing.  She reports she feels short of breath from what she thinks is severe anxiety  No chest pain.  No fevers no cough.  She has not had to use any inhalers at home recently except for a couple days after her last ER visit and not Better.  She is not wheezing any longer  No leg swelling.  No history of blood clots.  Not pregnant        Physical Exam   Triage Vital Signs: ED Triage Vitals  Enc Vitals Group     BP 04/11/22 0903 (!) 152/90     Pulse Rate 04/11/22 0903 (!) 103     Resp 04/11/22 0903 18     Temp 04/11/22 0904 98 F (36.7 C)     Temp Source 04/11/22 0904 Oral     SpO2 04/11/22 0903 95 %     Weight  04/11/22 0903 190 lb (86.2 kg)     Height 04/11/22 0903 '5\' 4"'$  (1.626 m)     Head Circumference --      Peak Flow --      Pain Score 04/11/22 0903 8     Pain Loc --      Pain Edu? --      Excl. in [place]? --     Most recent vital signs: Vitals:   04/11/22 0904 04/11/22 1000  BP:  129/83  Pulse:  93  Resp:  15  Temp: 98 F (36.7 C)   SpO2:  93%     General: Awake, no distress.  CV:  Good peripheral perfusion.  Resp:  Normal effort.  Abd:  No distention.  Other:     ED Results / Procedures / Treatments   Labs (all labs ordered are listed, but only abnormal results are displayed) Labs Reviewed  CBC - Abnormal; Notable for the following components:      Result Value   RBC 5.21 (*)    All other components within normal limits  BASIC METABOLIC PANEL - Abnormal; Notable for the following components:  Sodium 134 (*)    Glucose, Bld 109 (*)    All other components within normal limits  D-DIMER, QUANTITATIVE - Abnormal; Notable for the following components:   D-Dimer, Quant 2.76 (*)    All other components within normal limits     EKG  Reviewed interpreted by me at 9 AM heart rate 105 QRS 70 QTc 400 Sinus tachycardia otherwise normal   RADIOLOGY CT Angio Chest PE W and/or Wo Contrast  Result Date: 04/11/2022 CLINICAL DATA:  Chest pain, palpitations, positive D-dimer EXAM: CT ANGIOGRAPHY CHEST WITH CONTRAST TECHNIQUE: Multidetector CT imaging of the chest was performed using the standard protocol during bolus administration of intravenous contrast. Multiplanar CT image reconstructions and MIPs were obtained to evaluate the vascular anatomy. RADIATION DOSE REDUCTION: This exam was performed according to the departmental dose-optimization program which includes automated exposure control, adjustment of the mA and/or kV according to patient size and/or use of iterative reconstruction technique. CONTRAST:  21m OMNIPAQUE IOHEXOL 350 MG/ML SOLN COMPARISON:  CT done on  04/07/2016, chest radiograph done earlier today FINDINGS: Cardiovascular: There is homogeneous enhancement in thoracic aorta. Ascending thoracic aorta measures 3.4 cm. There are no intraluminal filling defects in the pulmonary artery branches. Mediastinum/Nodes: There are slightly enlarged lymph nodes in the mediastinum and both hilar regions. Lungs/Pleura: There is no focal pulmonary consolidation. There is centrilobular emphysema. Increased interstitial markings are seen in the periphery of both lungs with interval worsening. There is 4 mm pleural-based nodular density in the left lower lobe which appears stable. No new lung nodules are seen. There is no pleural effusion or pneumothorax. Upper Abdomen: Surgical clips are seen in gallbladder fossa. There is no dilation of bile ducts. Musculoskeletal: Unremarkable. Review of the MIP images confirms the above findings. IMPRESSION: There is no evidence of pulmonary artery embolism. There is no evidence of thoracic aortic dissection. COPD. There is prominence of interstitial markings in the periphery of both lungs suggesting interstitial lung disease. No focal pulmonary infiltrates are seen. Electronically Signed   By: PElmer PickerM.D.   On: 04/11/2022 10:39   DG Chest Portable 1 View  Result Date: 04/11/2022 CLINICAL DATA:  Shortness of breath. EXAM: PORTABLE CHEST 1 VIEW COMPARISON:  Apr 02, 2022 FINDINGS: Nonacute healed bilateral rib fractures. Bones are otherwise unremarkable. No pneumothorax. No nodules or masses. No focal infiltrates. Cardiomediastinal silhouette is stable. IMPRESSION: No active disease. Electronically Signed   By: DDorise BullionIII M.D.   On: 04/11/2022 09:53       PROCEDURES:  Critical Care performed: No  Procedures   MEDICATIONS ORDERED IN ED: Medications  sodium chloride 0.9 % bolus 500 mL (has no administration in time range)  LORazepam (ATIVAN) tablet 1 mg (1 mg Oral Given 04/11/22 0925)  iohexol (OMNIPAQUE)  350 MG/ML injection 75 mL (75 mLs Intravenous Contrast Given 04/11/22 1022)     IMPRESSION / MDM / ASSESSMENT AND PLAN / ED COURSE  I reviewed the triage vital signs and the nursing notes.                              Differential diagnosis includes, but is not limited to, COPD, pulmonary embolism though low pretest probability, ACS patient's EKG without ischemia and no chest pain I do not feel troponin testing is needed, panic, anxiety.  She does not have any signs or symptoms of an suggestive of infection, pneumothorax, ACS, or frankly COPD exacerbation.  Lung sounds  are clear her work of breathing is normal and she has no wheezing exhibited.  Rather she appears slightly more anxious and panic than anything on exam  The patient is on the cardiac monitor to evaluate for evidence of arrhythmia and/or significant heart rate changes.  We will trial oral Ativan to see if this helps her symptoms.  Notably she was on Ativan for several days 3 times daily and having run out 2 to 3 days ago she may also be experiencing some mild withdrawal-like symptoms.   Clinical Course as of 04/11/22 1058  Sun Apr 11, 2022  1020 D-dimer elevated.  We will proceed with CT angiography to exclude PE as potential cause of her dyspnea [MQ]    Clinical Course User Index [MQ] Delman Kitten, MD   ----------------------------------------- 10:58 AM on 04/11/2022 ----------------------------------------- Vitals:   04/11/22 0904 04/11/22 1000  BP:  129/83  Pulse:  93  Resp:  15  Temp: 98 F (36.7 C)   SpO2:  93%    Patient calm resting comfortably.  Work of breathing normal.  Reports she feels much better much calmer.  Her CT scan negative for acute pulmonary embolism.  Her lab work reassuring.  I think at this point she likely had a panic attack, her lungs are clear shows no signs of COPD exacerbation.  Also query if she may have had some elements of lorazepam withdrawal given her recent use and abrupt cessation a  couple days ago.  She has a primary care appointment coming up on Thursday, and I have discussed with her and I am willing to provide her prescription for lorazepam low-dose each evening, and have her follow-up with her primary Thursday.  Patient agreement with this as well as traditional return precautions regarding presentation dyspnea  Return precautions and treatment recommendations and follow-up discussed with the patient who is agreeable with the plan.  Patient does not drive and is agreeable not to drive will taking lorazepam.  Her husband is here will be taking her home  FINAL CLINICAL IMPRESSION(S) / ED DIAGNOSES   Final diagnoses:  Dyspnea, unspecified type  Panic attack     Rx / DC Orders   ED Discharge Orders          Ordered    Ambulatory referral to Pulmonology       Comments: New diagnosis. Multiple recent ED visits. Establishing a PCP Thursday.   04/11/22 1049    LORazepam (ATIVAN) 0.5 MG tablet  Daily at bedtime        04/11/22 1052             Note:  This document was prepared using Dragon voice recognition software and may include unintentional dictation errors.   Delman Kitten, MD 04/11/22 1059

## 2022-04-11 NOTE — Discharge Instructions (Addendum)
No driving today or within 8 hours of use of lorazepam.  Please follow-up with your new doctor as you have planned on Thursday, and also I agree that you should have them consider making referrals to a pulmonologist and psychiatrist as well.

## 2022-04-11 NOTE — ED Triage Notes (Signed)
Pt via POV from home. Pt c/o chest pain and palpations, states she has felt that her heart has been racing all night. Pt states she have squeezing feeling behind her R breast. Pt also endorses SOB and anxiety. Pt has hx of COPD. Pt had a recent change to her anxiety medications. Pt is A&Ox4 and NAD.

## 2022-04-15 ENCOUNTER — Emergency Department: Payer: Medicare Other

## 2022-04-15 ENCOUNTER — Other Ambulatory Visit: Payer: Self-pay

## 2022-04-15 ENCOUNTER — Ambulatory Visit: Payer: Self-pay | Admitting: Family

## 2022-04-15 ENCOUNTER — Emergency Department
Admission: EM | Admit: 2022-04-15 | Discharge: 2022-04-15 | Disposition: A | Discharge status: Home / Self Care | Payer: Medicare Other | Attending: Emergency Medicine | Admitting: Emergency Medicine

## 2022-04-15 DIAGNOSIS — R0602 Shortness of breath: Secondary | ICD-10-CM | POA: Insufficient documentation

## 2022-04-15 DIAGNOSIS — J449 Chronic obstructive pulmonary disease, unspecified: Secondary | ICD-10-CM | POA: Diagnosis not present

## 2022-04-15 DIAGNOSIS — F419 Anxiety disorder, unspecified: Secondary | ICD-10-CM | POA: Insufficient documentation

## 2022-04-15 LAB — CBC
HCT: 44.3 % (ref 36.0–46.0)
Hemoglobin: 14.9 g/dL (ref 12.0–15.0)
MCH: 27.6 pg (ref 26.0–34.0)
MCHC: 33.6 g/dL (ref 30.0–36.0)
MCV: 82.2 fL (ref 80.0–100.0)
Platelets: 307 10*3/uL (ref 150–400)
RBC: 5.39 MIL/uL — ABNORMAL HIGH (ref 3.87–5.11)
RDW: 12.6 % (ref 11.5–15.5)
WBC: 10.8 10*3/uL — ABNORMAL HIGH (ref 4.0–10.5)
nRBC: 0 % (ref 0.0–0.2)

## 2022-04-15 LAB — TROPONIN I (HIGH SENSITIVITY): Troponin I (High Sensitivity): 4 ng/L (ref <18)

## 2022-04-15 LAB — BASIC METABOLIC PANEL
Anion gap: 13 (ref 5–15)
BUN: 11 mg/dL (ref 6–20)
CO2: 19 mmol/L — ABNORMAL LOW (ref 22–32)
Calcium: 9.4 mg/dL (ref 8.9–10.3)
Chloride: 102 mmol/L (ref 98–111)
Creatinine, Ser: 0.53 mg/dL (ref 0.44–1.00)
GFR, Estimated: >60 mL/min (ref >60)
Glucose, Bld: 106 mg/dL — ABNORMAL HIGH (ref 70–99)
Potassium: 3.7 mmol/L (ref 3.5–5.1)
Sodium: 134 mmol/L — ABNORMAL LOW (ref 135–145)

## 2022-04-15 MED ORDER — LORAZEPAM 1 MG PO TABS
1.0000 mg | ORAL_TABLET | Freq: Once | ORAL | Status: AC
  Administered 2022-04-15: 1 mg via ORAL
  Filled 2022-04-15: qty 1

## 2022-04-15 NOTE — ED Provider Notes (Signed)
[facility] Provider Note    Event Date/Time   First MD Initiated Contact with Patient 04/15/22 1801     (approximate)   History   Chief Complaint Shortness of Breath   HPI  Veronica Rangel is a 56 y.o. female with past medical history of anxiety, rheumatoid arthritis, and fibromyalgia who presents to the ED complaining of shortness of breath.  Patient reports that for the past 5 days she has been feeling short of breath whenever she tries to do anything.  She states that she cannot "get myself to relax" and feels anxious much of the time.  She denies any fevers, cough, or chest pain.  She states that she has been dealing with anxiety for a long time and was prescribed a new SSRI about 1 month ago by her psychiatrist.  She states that she has taken Klonopin in the past for the symptoms but this was recently stopped when she got a new psychiatrist.     Physical Exam   Triage Vital Signs: ED Triage Vitals [04/15/22 1750]  Enc Vitals Group     BP (!) 146/89     Pulse Rate 95     Resp 20     Temp 97.7 F (36.5 C)     Temp src      SpO2 95 %     Weight      Height '5\' 4"'$  (1.626 m)     Head Circumference      Peak Flow      Pain Score 4     Pain Loc      Pain Edu?      Excl. in [place]?     Most recent vital signs: Vitals:   04/15/22 1750  BP: (!) 146/89  Pulse: 95  Resp: 20  Temp: 97.7 F (36.5 C)  SpO2: 95%    Constitutional: Alert and oriented, anxious appearing and tearful. Eyes: Conjunctivae are normal. Head: Atraumatic. Nose: No congestion/rhinnorhea. Mouth/Throat: Mucous membranes are moist.  Cardiovascular: Normal rate, regular rhythm. Grossly normal heart sounds.  2+ radial pulses bilaterally. Respiratory: Normal respiratory effort.  No retractions. Lungs CTAB. Gastrointestinal: Soft and nontender. No distention. Musculoskeletal: No lower extremity tenderness nor edema.  Neurologic:  Normal speech and language. No gross focal neurologic  deficits are appreciated.    ED Results / Procedures / Treatments   Labs (all labs ordered are listed, but only abnormal results are displayed) Labs Reviewed  BASIC METABOLIC PANEL - Abnormal; Notable for the following components:      Result Value   Sodium 134 (*)    CO2 19 (*)    Glucose, Bld 106 (*)    All other components within normal limits  CBC - Abnormal; Notable for the following components:   WBC 10.8 (*)    RBC 5.39 (*)    All other components within normal limits  TROPONIN I (HIGH SENSITIVITY)     EKG  ED ECG REPORT I, Blake Divine, the attending physician, personally viewed and interpreted this ECG.   Date: 04/15/2022  EKG Time: 17:45  Rate: 93  Rhythm: normal sinus rhythm  Axis: Normal  Intervals:none  ST&T Change: None  RADIOLOGY Chest x-ray reviewed by me with no infiltrate, edema, or effusion.  PROCEDURES:  Critical Care performed: No  Procedures   MEDICATIONS ORDERED IN ED: Medications  LORazepam (ATIVAN) tablet 1 mg (1 mg Oral Given 04/15/22 1845)     IMPRESSION / MDM / ASSESSMENT AND PLAN /  ED COURSE  I reviewed the triage vital signs and the nursing notes.                              56 y.o. female with past medical history of COPD, rheumatoid arthritis, fibromyalgia, and anxiety who presents to the ED complaining of ongoing shortness of breath and anxiety without cough or chest pain.  Differential diagnosis includes, but is not limited to, COPD exacerbation, pneumonia, CHF, ACS, PE, and anxiety.  Patient well-appearing and in no acute distress, vital signs are unremarkable and she is breathing comfortably on room air.  She has no wheezing on exam to suggest COPD exacerbation, but does appear extremely anxious, stating that she has a hard time relaxing and becomes tearful.  EKG shows no evidence of arrhythmia or ischemia, troponin is pending but I have low suspicion for ACS.  Given reassuring vital signs, low suspicion for PE.  Chest  x-ray shows no pneumonia or CHF, symptoms seem consistent with anxiety.  We will treat symptomatically with Ativan and reassess while work-up pending.  She did present for similar symptoms 4 days ago and had a CTA performed that was negative.  Troponin within normal limits, CBC shows no anemia or leukocytosis, BMP without electrolyte abnormality or AKI.  Symptoms seem to be due to significant anxiety given reassuring work-up.  Patient reports feeling better following dose of Ativan and is appropriate for discharge home with PCP and psychiatry follow-up.  She was counseled to return to the ED for new worsening symptoms, patient agrees with plan.      FINAL CLINICAL IMPRESSION(S) / ED DIAGNOSES   Final diagnoses:  Shortness of breath  Anxiety     Rx / DC Orders   ED Discharge Orders     None        Note:  This document was prepared using Dragon voice recognition software and may include unintentional dictation errors.   Blake Divine, MD 04/15/22 [phone]

## 2022-04-15 NOTE — ED Notes (Signed)
See triage note. Pt reports she has been shob for past couple days intermittent. States feels like it might be anxiety and would feel better if she could just relax

## 2022-04-15 NOTE — ED Notes (Signed)
Patient had additional questions for MD.  MD at bedside at time of discharge to discuss medication questions with patient.

## 2022-04-15 NOTE — ED Triage Notes (Signed)
Patient to ER via POV from home. Reports shortness of breath and anxiety. States that for the last several days she has been unable to relax, has been short winded and states that she feels as though she can't catch her breath. Hx of COPD. States that she has had panic attacks since childhood. Patient in NAD at this time. Alert and oriented.

## 2022-04-23 ENCOUNTER — Encounter: Payer: Self-pay | Admitting: Family

## 2022-04-25 NOTE — Progress Notes (Signed)
This encounter was created in error - please disregard.

## 2022-04-27 ENCOUNTER — Emergency Department: Payer: Medicare Other

## 2022-04-27 ENCOUNTER — Other Ambulatory Visit: Payer: Self-pay

## 2022-04-27 ENCOUNTER — Emergency Department
Admission: EM | Admit: 2022-04-27 | Discharge: 2022-04-27 | Disposition: A | Discharge status: Home / Self Care | Payer: Medicare Other | Attending: Emergency Medicine | Admitting: Emergency Medicine

## 2022-04-27 ENCOUNTER — Encounter: Payer: Self-pay | Admitting: Emergency Medicine

## 2022-04-27 DIAGNOSIS — F419 Anxiety disorder, unspecified: Secondary | ICD-10-CM | POA: Diagnosis not present

## 2022-04-27 DIAGNOSIS — R0602 Shortness of breath: Secondary | ICD-10-CM | POA: Diagnosis present

## 2022-04-27 DIAGNOSIS — J441 Chronic obstructive pulmonary disease with (acute) exacerbation: Secondary | ICD-10-CM | POA: Diagnosis not present

## 2022-04-27 DIAGNOSIS — F17200 Nicotine dependence, unspecified, uncomplicated: Secondary | ICD-10-CM | POA: Insufficient documentation

## 2022-04-27 LAB — CBC
HCT: 43.5 % (ref 36.0–46.0)
Hemoglobin: 14.8 g/dL (ref 12.0–15.0)
MCH: 28 pg (ref 26.0–34.0)
MCHC: 34 g/dL (ref 30.0–36.0)
MCV: 82.2 fL (ref 80.0–100.0)
Platelets: 307 10*3/uL (ref 150–400)
RBC: 5.29 MIL/uL — ABNORMAL HIGH (ref 3.87–5.11)
RDW: 13 % (ref 11.5–15.5)
WBC: 9.6 10*3/uL (ref 4.0–10.5)
nRBC: 0 % (ref 0.0–0.2)

## 2022-04-27 LAB — BASIC METABOLIC PANEL
Anion gap: 10 (ref 5–15)
BUN: 9 mg/dL (ref 6–20)
CO2: 21 mmol/L — ABNORMAL LOW (ref 22–32)
Calcium: 9.3 mg/dL (ref 8.9–10.3)
Chloride: 107 mmol/L (ref 98–111)
Creatinine, Ser: 0.54 mg/dL (ref 0.44–1.00)
GFR, Estimated: >60 mL/min (ref >60)
Glucose, Bld: 107 mg/dL — ABNORMAL HIGH (ref 70–99)
Potassium: 4 mmol/L (ref 3.5–5.1)
Sodium: 138 mmol/L (ref 135–145)

## 2022-04-27 MED ORDER — LORAZEPAM 0.5 MG PO TABS
0.5000 mg | ORAL_TABLET | Freq: Once | ORAL | Status: AC
  Administered 2022-04-27: 0.5 mg via ORAL
  Filled 2022-04-27: qty 1

## 2022-04-27 MED ORDER — LORAZEPAM 0.5 MG PO TABS: 0.5000 mg | ORAL_TABLET | Freq: Three times a day (TID) | ORAL | 0 refills | Status: DC | PRN

## 2022-04-27 MED ORDER — PREDNISONE 50 MG PO TABS: 50.0000 mg | ORAL_TABLET | Freq: Every day | ORAL | 0 refills | Status: DC

## 2022-04-27 MED ORDER — ALBUTEROL SULFATE (2.5 MG/3ML) 0.083% IN NEBU
2.5000 mg | INHALATION_SOLUTION | RESPIRATORY_TRACT | Status: DC | PRN
  Filled 2022-04-27: qty 3

## 2022-04-27 MED ORDER — PREDNISONE 20 MG PO TABS
50.0000 mg | ORAL_TABLET | Freq: Once | ORAL | Status: AC
  Administered 2022-04-27: 50 mg via ORAL
  Filled 2022-04-27: qty 1

## 2022-04-27 NOTE — ED Provider Notes (Signed)
[facility] Provider Note    Event Date/Time   First MD Initiated Contact with Patient 04/27/22 1106     (approximate)   History   Shortness of Breath   HPI  Veronica Rangel is a 56 y.o. female  with a history of fibromyalgia, RA  who is a current smoker who presents with complaint of SOB. Patient reports weeks of sob, has pulm f/u tomorrow. Has used inhaler with little improvement. Also complains of significant anxiety related to sob. No fever or chills. No chest pain.      Physical Exam   Triage Vital Signs: ED Triage Vitals  Enc Vitals Group     BP 04/27/22 1040 (!) 143/88     Pulse Rate 04/27/22 1040 (!) 107     Resp 04/27/22 1040 17     Temp 04/27/22 1040 98.4 F (36.9 C)     Temp Source 04/27/22 1040 Oral     SpO2 04/27/22 1040 96 %     Weight 04/27/22 1017 86.1 kg (189 lb 13.1 oz)     Height 04/27/22 1017 1.626 m ('5\' 4"'$ )     Head Circumference --      Peak Flow --      Pain Score 04/27/22 1017 0     Pain Loc --      Pain Edu? --      Excl. in [place]? --     Most recent vital signs: Vitals:   04/27/22 1040  BP: (!) 143/88  Pulse: (!) 107  Resp: 17  Temp: 98.4 F (36.9 C)  SpO2: 96%     General: Awake, no distress. anxious CV:  Good peripheral perfusion.  Resp:  Normal effort. CTA overall Abd:  No distention.  Other:     ED Results / Procedures / Treatments   Labs (all labs ordered are listed, but only abnormal results are displayed) Labs Reviewed  BASIC METABOLIC PANEL - Abnormal; Notable for the following components:      Result Value   CO2 21 (*)    Glucose, Bld 107 (*)    All other components within normal limits  CBC - Abnormal; Notable for the following components:   RBC 5.29 (*)    All other components within normal limits     EKG  ED ECG REPORT I, Lavonia Drafts, the attending physician, personally viewed and interpreted this ECG.  Date: 04/27/2022  Rhythm: normal sinus rhythm QRS Axis:  normal Intervals: normal ST/T Wave abnormalities: normal Narrative Interpretation: no evidence of acute ischemia    RADIOLOGY Cxr viewed and interpreted by me no infiltrate or ptx    PROCEDURES:  Critical Care performed:   Procedures   MEDICATIONS ORDERED IN ED: Medications  albuterol (PROVENTIL) (2.5 MG/3ML) 0.083% nebulizer solution 2.5 mg (has no administration in time range)  LORazepam (ATIVAN) tablet 0.5 mg (0.5 mg Oral Given 04/27/22 1110)  predniSONE (DELTASONE) tablet 50 mg (50 mg Oral Given 04/27/22 1110)     IMPRESSION / MDM / ASSESSMENT AND PLAN / ED COURSE  I reviewed the triage vital signs and the nursing notes. Patient's presentation is most consistent with exacerbation of chronic illness.  Patient presents with SOB as noted above.  Unclear whether she has been diagnosed with COPD but she has pulm f/u tomorrow.  Differential copd exacerbation, pna, pulm edema  Exam is overall reassuring. Tachycardia likely related to significant anxiety.   CXR without acute abnormality   Cbc and bmp unremarkable  Will start  the patient on prednisone burst and brief course of ativan for anxiety.   Patient agrees with this plan, has f/u tomorrow        FINAL CLINICAL IMPRESSION(S) / ED DIAGNOSES   Final diagnoses:  COPD exacerbation ([place])     Rx / DC Orders   ED Discharge Orders          Ordered    predniSONE (DELTASONE) 50 MG tablet  Daily with breakfast        04/27/22 1108    LORazepam (ATIVAN) 0.5 MG tablet  Every 8 hours PRN        04/27/22 1108             Note:  This document was prepared using Dragon voice recognition software and may include unintentional dictation errors.   Lavonia Drafts, MD 04/27/22 [phone]

## 2022-04-27 NOTE — ED Triage Notes (Signed)
C/O SOB x 2 days.  States has history of COPD.  Has appointment with Pulmonology tomorrow.  Has been using albuterol, but no relief.  Denies wheezing, just states can't get a 'full breath'.  AAOx3.  Skin warm and dry.  Talks with strong voice, in full sentences. No SOB/ DOE observed.  NAD

## 2022-04-28 ENCOUNTER — Encounter: Payer: Self-pay | Admitting: Internal Medicine

## 2022-04-28 ENCOUNTER — Ambulatory Visit (INDEPENDENT_AMBULATORY_CARE_PROVIDER_SITE_OTHER): Payer: Medicare Other | Admitting: Internal Medicine

## 2022-04-28 DIAGNOSIS — J449 Chronic obstructive pulmonary disease, unspecified: Secondary | ICD-10-CM

## 2022-04-28 DIAGNOSIS — F17210 Nicotine dependence, cigarettes, uncomplicated: Secondary | ICD-10-CM | POA: Insufficient documentation

## 2022-04-28 NOTE — Progress Notes (Signed)
Veronica Rangel, female    DOB: 09/12/1966   MRN: [id number]   Brief patient profile:  5  yowf active smoker carries dx of RA/fibromyalgia with tendency to sinus problems  surgery  age 56 or 80 and eventually improved referred to pulmonary clinic 04/28/2022 by [facility]  for sob         History of Present Illness  04/28/2022  Pulmonary/ 1st office eval/Wahid Holley  Chief Complaint  Patient presents with   Consult    Pt states she has been having complaints of SOB. States she has also been told that she had COPD.  Dyspnea:  walked until  a few months prior to OV  /  20 minutes with [place] dog walking  Cough: none though aware of pnds watery Sleep: ok after ativan  2 pillows / bed is flat  SABA use: albuterol  for tightness  seems to help  No obvious patterns in day to day or daytime pattern/variability or assoc excess/ purulent sputum or mucus plugs or hemoptysis or cp or chest tightness, subjective wheeze or overt sinus or hb symptoms.   Sleeping p ativan fine without nocturnal  or early am exacerbation  of respiratory  c/o's or need for noct saba. Also denies any obvious fluctuation of symptoms with weather or environmental changes or other aggravating or alleviating factors except as outlined above   No unusual exposure hx or h/o childhood pna/ asthma or knowledge of premature birth.  Current Allergies, Complete Past Medical History, Past Surgical History, Family History, and Social History were reviewed in [employer] record.  ROS  The following are not active complaints unless bolded Hoarseness, sore throat, dysphagia, dental problems, itching, sneezing,  nasal congestion or discharge of excess mucus or purulent secretions, ear ache,   fever, chills, sweats, unintended wt loss or wt gain, classically pleuritic or exertional cp,  orthopnea pnd or arm/hand swelling  or leg swelling, presyncope, palpitations, abdominal pain, anorexia, nausea, vomiting, diarrhea  or change in bowel  habits or change in bladder habits, change in stools or change in urine, dysuria, hematuria,  rash, arthralgias, visual complaints, headache, numbness, weakness or ataxia or problems with walking or coordination,  change in mood or  memory.           Past Medical History:  Diagnosis Date   Cancer ([place])    renal   Fibromyalgia    RA (rheumatoid arthritis) (HCC)     Outpatient Medications Prior to Visit  Medication Sig Dispense Refill   albuterol (VENTOLIN HFA) 108 (90 Base) MCG/ACT inhaler Inhale 2 puffs into the lungs every 4 (four) hours as needed for wheezing or shortness of breath. 1 each 1   LORazepam (ATIVAN) 0.5 MG tablet Take 1 tablet (0.5 mg total) by mouth every 8 (eight) hours as needed for anxiety. 15 tablet 0   predniSONE (DELTASONE) 50 MG tablet Take 1 tablet (50 mg total) by mouth daily with breakfast. 4 tablet 0   hydrOXYzine (VISTARIL) 25 MG capsule Take 1 capsule (25 mg total) by mouth 3 (three) times daily as needed for anxiety. 30 capsule 1   No facility-administered medications prior to visit.     Objective:     BP 136/90 (BP Location: Left Arm, Patient Position: Sitting, Cuff Size: Normal)   Pulse (!) 115   Temp 97.8 F (36.6 C) (Oral)   Ht '5\' 4"'$  (1.626 m)   Wt 182 lb (82.6 kg)   SpO2 99% Comment: RA  BMI 31.24  kg/m   SpO2: 99 % (RA)  Obese slt hoarse wm nad  prominent pseudowheeze better with plm    HEENT : Oropharynx  clear / min watery pnd/ no teeth  Nasal turbinates nl    NECK :  without  apparent JVD/ palpable Nodes/ thyroidectomy scar    LUNGS: no acc muscle use,  Min barrel  contour chest wall with bilateral  slightly decreased bs s audible wheeze and  without cough on insp or exp maneuvers and min  Hyperresonant  to  percussion bilaterally    CV:  RRR  no s3 or murmur or increase in P2, and no edema   ABD:  soft and nontender with pos end  insp Hoover's  in the supine position.  No bruits or organomegaly appreciated   MS:  Nl gait/ ext  warm without deformities Or obvious joint restrictions  calf tenderness, cyanosis or clubbing     SKIN: warm and dry without lesions    NEURO:  alert, approp, nl sensorium with  no motor or cerebellar deficits apparent.           I personally reviewed images and agree with radiology impression as follows:   Chest CTa 04/11/22   There is no evidence of pulmonary artery embolism. There is no evidence of thoracic aortic dissection.   COPD. There is prominence of interstitial markings in the periphery of both lungs suggesting interstitial lung disease. No focal pulmonary infiltrates are seen. .    Assessment   COPD 0/ active smoker with ? also ILD  Active smoker with severe chronic anxiety disorder/panic disorder  - onset doe early spring 2023 - Spirometry 04/28/2022  FEV1 2.2  (85%)  Ratio 0.78 p no rx and f/v nl  - 04/28/2022   Walked on RA   x  3   lap(s) =  approx 750  ft  @ avg pace, stopped due to end of study with lowest 02 sats 95%    DDX of  difficult airways management almost all start with A and  include Adherence, Ace Inhibitors, Acid Reflux, Active Sinus Disease, Alpha 1 Antitripsin deficiency, Anxiety masquerading as Airways dz,  ABPA,  Allergy(esp in young), Aspiration (esp in elderly), Adverse effects of meds,  Active smoking or vaping, A bunch of PE's (a small clot burden can't cause this syndrome unless there is already severe underlying pulm or vascular dz with poor reserve) plus two Bs  = Bronchiectasis and Beta blocker use..and one C= CHF  Potential problems identified as above.  I am not convince this is allergy/asthma and note she  has emphysema on ct but this is mild and no significant copd.  Concerned about evolving ILD on CT but this is likely RBILD or DIP, not UIP clinically and rx for both of the former are best treated by d/c smoking (see separate a/p)   rec for now Sub max ex should improve panic disorder and help sort out that component which should not occur  with exertion reproducibly gerd rx x 6 weeks to see if pseudowheeze improves  F/u in 6 weeks to arrange pfts/ hrct surveillance.  Each maintenance medication was reviewed in detail including emphasizing most importantly the difference between maintenance and prns and under what circumstances the prns are to be triggered using an action plan format where appropriate.  Total time for H and P, chart review, counseling, reviewing hfa device(s) , directly observing portions of ambulatory 02 saturation study/ and generating customized AVS unique to  this new pt office visit / same day charting  > 45 min for   refractory respiratory  symptoms of uncertain etiology                Cigarette smoker 4-5 min discussion re active cigarette smoking in addition to office E&M  Ask about tobacco use:   ongoing Advise quitting   > 3 min discussion I reviewed the Fletcher curve with the patient that basically indicates  if you quit smoking when your best day FEV1 is still well preserved (as is clearly  the case here)  it is highly unlikely you will progress to severe disease and informed the patient there was  no medication on the market that has proven to alter the curve/ its downward trajectory  or the likelihood of progression of their disease(unlike other chronic medical conditions such as atheroclerosis where we do think we can change the natural hx with risk reducing meds)    Therefore stopping smoking and maintaining abstinence are  the most important aspects of her  care, not choice of inhalers or for that matter, doctors.   Treatment other than smoking cessation  is entirely directed by severity of symptoms and focused also on reducing exacerbations, not attempting to change the natural history of the disease.   Assess willingness:  Not committed at this point Assist in quit attempt:  Per PCP when ready Arrange follow up:   Follow up per Primary Care planned           Christinia Gully, MD 04/28/2022

## 2022-04-28 NOTE — Assessment & Plan Note (Addendum)
4-5 min discussion re active cigarette smoking in addition to office E&M Ask about tobacco use:   ongoing Advise quitting   > 3 min discussion I reviewed the Fletcher curve with the patient that basically indicates  if you quit smoking when your best day FEV1 is still well preserved (as is clearly  the case here)  it is highly unlikely you will progress to severe disease and informed the patient there was  no medication on the market that has proven to alter the curve/ its downward trajectory  or the likelihood of progression of their disease(unlike other chronic medical conditions such as atheroclerosis where we do think we can change the natural hx with risk reducing meds)    Therefore stopping smoking and maintaining abstinence are  the most important aspects of her  care, not choice of inhalers or for that matter, doctors.  Assess willingness:  Not committed at this point Assist in quit attempt:  Per PCP when ready Arrange follow up:   Follow up per Primary Care planned

## 2022-04-28 NOTE — Assessment & Plan Note (Addendum)
Active smoker with severe chronic anxiety disorder/panic disorder  - onset doe early spring 2023 - Spirometry 04/28/2022  FEV1 2.2  (85%)  Ratio 0.78 p no rx and f/v nl  - 04/28/2022   Walked on RA   x  3   lap(s) =  approx 750  ft  @ avg pace, stopped due to end of study with lowest 02 sats 95%    DDX of  difficult airways management almost all start with A and  include Adherence, Ace Inhibitors, Acid Reflux, Active Sinus Disease, Alpha 1 Antitripsin deficiency, Anxiety masquerading as Airways dz,  ABPA,  Allergy(esp in young), Aspiration (esp in elderly), Adverse effects of meds,  Active smoking or vaping, A bunch of PE's (a small clot burden can't cause this syndrome unless there is already severe underlying pulm or vascular dz with poor reserve) plus two Bs  = Bronchiectasis and Beta blocker use..and one C= CHF  Potential problems identified as above.  I am not convince this is allergy/asthma and note she  has emphysema on ct but this is mild and no significant copd.  Concerned about evolving ILD on CT but this is likely RBILD or DIP, not UIP clinically and rx for both of the former are best treated by d/c smoking (see separate a/p)   rec for now Sub max ex should improve panic disorder and help sort out that component which should not occur with exertion reproducibly gerd rx x 6 weeks to see if pseudowheeze improves  F/u in 6 weeks to arrange pfts/ hrct surveillance.  Each maintenance medication was reviewed in detail including emphasizing most importantly the difference between maintenance and prns and under what circumstances the prns are to be triggered using an action plan format where appropriate.  Total time for H and P, chart review, counseling, reviewing hfa device(s) , directly observing portions of ambulatory 02 saturation study/ and generating customized AVS unique to this new pt office visit / same day charting  > 45 min for   refractory respiratory  symptoms of uncertain etiology

## 2022-04-28 NOTE — Patient Instructions (Addendum)
The key is to stop smoking completely before smoking completely stops you! - it's not too late!  For drainage / throat tickle try take CHLORPHENIRAMINE  4 mg  ("Allergy Relief" '4mg'$   at [facility] should be easiest to find in the blue box usually on bottom shelf)  take one every 4 hours as needed - extremely effective and inexpensive over the counter- may cause drowsiness so start with just a dose or two an hour before bedtime and see how you tolerate it before trying in daytime.   Pantoprazole (protonix) 40 mg   Take  30-60 min before first meal of the day and Pepcid (famotidine)  20 mg after supper until return to office - this is the best way to tell whether stomach acid is contributing to your problem.     GERD (REFLUX)  is an extremely common cause of respiratory symptoms just like yours , many times with no obvious heartburn at all.    It can be treated with medication, but also with lifestyle changes including elevation of the head of your bed (ideally with 6 -8inch blocks under the headboard of your bed),  Smoking cessation, avoidance of late meals, excessive alcohol, and avoid fatty foods, chocolate, peppermint, colas, red wine, and acidic juices such as orange juice.  NO MINT OR MENTHOL PRODUCTS SO NO COUGH DROPS  USE SUGARLESS CANDY INSTEAD (Jolley ranchers or Stover's or Life Savers) or even ice chips will also do - the key is to swallow to prevent all throat clearing. NO OIL BASED VITAMINS - use powdered substitutes.  Avoid fish oil when coughing.   To get the most out of exercise, you need to be continuously aware that you are short of breath, but never out of breath,build up to 30 minutes daily. As you improve, it will actually be easier for you to do the same amount of exercise  in  30 minutes so always push to the level where you are short of breath.      Please schedule a follow up office visit in 6 weeks, call sooner if needed [facility] clinic is fine or  here

## 2022-04-30 ENCOUNTER — Telehealth: Payer: Self-pay | Admitting: Internal Medicine

## 2022-04-30 MED ORDER — PANTOPRAZOLE SODIUM 40 MG PO TBEC
40.0000 mg | DELAYED_RELEASE_TABLET | Freq: Every day | ORAL | 5 refills | Status: DC
Start: 2022-04-30 — End: 2022-05-27

## 2022-04-30 NOTE — Telephone Encounter (Signed)
Ok to do protonix 40 mg Take 30-60 min before first meal of the day and prilosec otc x one 30 min before supper instead

## 2022-04-30 NOTE — Telephone Encounter (Signed)
Called and spoke with pt letting her know the info stated by Dr. Melvyn Novas and she verbalized understanding. While speaking with pt stated that meds (protonix and pepcid) were never sent to pharmacy after OV. I have sent the protonix to the pharmacy for pt.  Before sending the pepcid to the pharmacy, pt stated that she forgot to mention at [place] that she is on effexor for antidepressant and has been told that pepcid will interact with effexor. Dr. Melvyn Novas, please advise on this.

## 2022-04-30 NOTE — Telephone Encounter (Signed)
If it comes on at rest but not with exertion or proportionate to exertion then it's most likely panic attack and she see that doctor

## 2022-04-30 NOTE — Telephone Encounter (Signed)
Patient states she has a hard time getting a good deep breath.   Has albuterol inhaler but states this does not help her.   Primary Pulmonologist: Dr. Melvyn Novas  Last office visit and with whom: Dr. Melvyn Novas 04/28/2022  What do we see them for (pulmonary problems): COPD  Last OV assessment/plan: see below  Was appointment offered to patient (explain)?  No patient was seen on 04/28/2022   Reason for call: Patient states she is having a hard time getting a good deep breath and she feels SOB. She states she has an albuterol inhaler but it Is not helping her. She states shes unsure if it is anxiety related because sometimes it goes away if she tries to take her mind off of it and sometimes it doesn't and is persistent. Patient denies wheezing, coughing, fevers, or chest pain/pressure/tightness.    Allergies  Allergen Reactions   Codeine Anaphylaxis   Prednisone    Amoxicillin-Pot Clavulanate Rash     There is no immunization history on file for this patient.  Assessment    COPD 0/ active smoker with ? also ILD  Active smoker with severe chronic anxiety disorder/panic disorder  - onset doe early spring 2023 - Spirometry 04/28/2022  FEV1 2.2  (85%)  Ratio 0.78 p no rx and f/v nl  - 04/28/2022   Walked on RA   x  3   lap(s) =  approx 750  ft  @ avg pace, stopped due to end of study with lowest 02 sats 95%     DDX of  difficult airways management almost all start with A and  include Adherence, Ace Inhibitors, Acid Reflux, Active Sinus Disease, Alpha 1 Antitripsin deficiency, Anxiety masquerading as Airways dz,  ABPA,  Allergy(esp in young), Aspiration (esp in elderly), Adverse effects of meds,  Active smoking or vaping, A bunch of PE's (a small clot burden can't cause this syndrome unless there is already severe underlying pulm or vascular dz with poor reserve) plus two Bs  = Bronchiectasis and Beta blocker use..and one C= CHF   Potential problems identified as above.  I am not convince this is  allergy/asthma and note she  has emphysema on ct but this is mild and no significant copd.  Concerned about evolving ILD on CT but this is likely RBILD or DIP, not UIP clinically and rx for both of the former are best treated by d/c smoking (see separate a/p)    rec for now Sub max ex should improve panic disorder and help sort out that component which should not occur with exertion reproducibly gerd rx x 6 weeks to see if pseudowheeze improves  F/u in 6 weeks to arrange pfts/ hrct surveillance.   Each maintenance medication was reviewed in detail including emphasizing most importantly the difference between maintenance and prns and under what circumstances the prns are to be triggered using an action plan format where appropriate.   Total time for H and P, chart review, counseling, reviewing hfa device(s) , directly observing portions of ambulatory 02 saturation study/ and generating customized AVS unique to this new pt office visit / same day charting  > 45 min for   refractory respiratory  symptoms of uncertain etiology                 Cigarette smoker 4-5 min discussion re active cigarette smoking in addition to office E&M   Ask about tobacco use:   ongoing Advise quitting   > 3 min discussion I  reviewed the Fletcher curve with the patient that basically indicates  if you quit smoking when your best day FEV1 is still well preserved (as is clearly  the case here)  it is highly unlikely you will progress to severe disease and informed the patient there was  no medication on the market that has proven to alter the curve/ its downward trajectory  or the likelihood of progression of their disease(unlike other chronic medical conditions such as atheroclerosis where we do think we can change the natural hx with risk reducing meds)    Therefore stopping smoking and maintaining abstinence are  the most important aspects of her  care, not choice of inhalers or for that matter, doctors.    Treatment  other than smoking cessation  is entirely directed by severity of symptoms and focused also on reducing exacerbations, not attempting to change the natural history of the disease.   Assess willingness:  Not committed at this point Assist in quit attempt:  Per PCP when ready Arrange follow up:   Follow up per Primary Care planned                Christinia Gully, MD 04/28/2022        Assessment & Plan Note by Tanda Rockers, MD at 04/28/2022 4:03 PM  Author: Tanda Rockers, MD Author Type: Physician Filed: 04/28/2022  4:05 PM  Note Status: [place] List: Cosign Not Required Encounter Date: 04/28/2022  Problem: Cigarette smoker  Editor: Tanda Rockers, MD (Physician)      Prior Versions: 1. Tanda Rockers, MD (Physician) at 04/28/2022  4:03 PM - Written  4-5 min discussion re active cigarette smoking in addition to office E&M Ask about tobacco use:   ongoing Advise quitting   > 3 min discussion I reviewed the Fletcher curve with the patient that basically indicates  if you quit smoking when your best day FEV1 is still well preserved (as is clearly  the case here)  it is highly unlikely you will progress to severe disease and informed the patient there was  no medication on the market that has proven to alter the curve/ its downward trajectory  or the likelihood of progression of their disease(unlike other chronic medical conditions such as atheroclerosis where we do think we can change the natural hx with risk reducing meds)    Therefore stopping smoking and maintaining abstinence are  the most important aspects of her  care, not choice of inhalers or for that matter, doctors.  Assess willingness:  Not committed at this point Assist in quit attempt:  Per PCP when ready Arrange follow up:   Follow up per Primary Care planned                 Assessment & Plan Note by Tanda Rockers, MD at 04/28/2022 3:39 PM  Author: Tanda Rockers, MD Author Type: Physician Filed: 04/28/2022  4:04 PM  Note  Status: Bernell List: Cosign Not Required Encounter Date: 04/28/2022  Problem: COPD 0/ active smoker with ? also ILD   Editor: Tanda Rockers, MD (Physician)      Prior Versions: 1. Tanda Rockers, MD (Physician) at 04/28/2022  4:00 PM - Edited   2. Tanda Rockers, MD (Physician) at 04/28/2022  3:41 PM - Written  Active smoker with severe chronic anxiety disorder/panic disorder  - onset doe early spring 2023 - Spirometry 04/28/2022  FEV1 2.2  (85%)  Ratio 0.78 p no rx and f/v  nl  - 04/28/2022   Walked on RA   x  3   lap(s) =  approx 750  ft  @ avg pace, stopped due to end of study with lowest 02 sats 95%     DDX of  difficult airways management almost all start with A and  include Adherence, Ace Inhibitors, Acid Reflux, Active Sinus Disease, Alpha 1 Antitripsin deficiency, Anxiety masquerading as Airways dz,  ABPA,  Allergy(esp in young), Aspiration (esp in elderly), Adverse effects of meds,  Active smoking or vaping, A bunch of PE's (a small clot burden can't cause this syndrome unless there is already severe underlying pulm or vascular dz with poor reserve) plus two Bs  = Bronchiectasis and Beta blocker use..and one C= CHF   Potential problems identified as above.  I am not convince this is allergy/asthma and note she  has emphysema on ct but this is mild and no significant copd.  Concerned about evolving ILD on CT but this is likely RBILD or DIP, not UIP clinically and rx for both of the former are best treated by d/c smoking (see separate a/p)    rec for now Sub max ex should improve panic disorder and help sort out that component which should not occur with exertion reproducibly gerd rx x 6 weeks to see if pseudowheeze improves  F/u in 6 weeks to arrange pfts/ hrct surveillance.   Each maintenance medication was reviewed in detail including emphasizing most importantly the difference between maintenance and prns and under what circumstances the prns are to be triggered using an action plan format  where appropriate.   Total time for H and P, chart review, counseling, reviewing hfa device(s) , directly observing portions of ambulatory 02 saturation study/ and generating customized AVS unique to this new pt office visit / same day charting  > 45 min for   refractory respiratory  symptoms of uncertain etiology                      Patient Instructions by Tanda Rockers, MD at 04/28/2022 2:15 PM  Author: Tanda Rockers, MD Author Type: Physician Filed: 04/28/2022  3:31 PM  Note Status: Addendum Cosign: Cosign Not Required Encounter Date: 04/28/2022  Editor: Tanda Rockers, MD (Physician)      Prior Versions: 1. Tanda Rockers, MD (Physician) at 04/28/2022  3:26 PM - Signed  The key is to stop smoking completely before smoking completely stops you! - it's not too late!   For drainage / throat tickle try take CHLORPHENIRAMINE  4 mg  ("Allergy Relief" '4mg'$   at [place] [facility] should be easiest to find in the blue box usually on bottom shelf)  take one every 4 hours as needed - extremely effective and inexpensive over the counter- may cause drowsiness so start with just a dose or two an hour before bedtime and see how you tolerate it before trying in daytime.    Pantoprazole (protonix) 40 mg   Take  30-60 min before first meal of the day and Pepcid (famotidine)  20 mg after supper until return to office - this is the best way to tell whether stomach acid is contributing to your problem.       GERD (REFLUX)  is an extremely common cause of respiratory symptoms just like yours , many times with no obvious heartburn at all.     It can be treated with medication, but also with lifestyle changes including elevation of  the head of your bed (ideally with 6 -8inch blocks under the headboard of your bed),  Smoking cessation, avoidance of late meals, excessive alcohol, and avoid fatty foods, chocolate, peppermint, colas, red wine, and acidic juices such as orange juice.  NO MINT OR MENTHOL PRODUCTS SO  NO COUGH DROPS  USE SUGARLESS CANDY INSTEAD (Jolley ranchers or Stover's or Life Savers) or even ice chips will also do - the key is to swallow to prevent all throat clearing. NO OIL BASED VITAMINS - use powdered substitutes.  Avoid fish oil when coughing.     To get the most out of exercise, you need to be continuously aware that you are short of breath, but never out of breath,build up to 30 minutes daily. As you improve, it will actually be easier for you to do the same amount of exercise  in  30 minutes so always push to the level where you are short of breath.        Please schedule a follow up office visit in 6 weeks, call sooner if needed [facility] clinic is fine or here

## 2022-05-03 NOTE — Telephone Encounter (Addendum)
Called and spoke with pt letting her know the info per MW and she verbalized understanding. Nothing further needed.

## 2022-05-05 ENCOUNTER — Emergency Department
Admission: EM | Admit: 2022-05-05 | Discharge: 2022-05-05 | Disposition: A | Discharge status: Home / Self Care | Payer: Medicare Other | Attending: Student in an Organized Health Care Education/Training Program | Admitting: Student in an Organized Health Care Education/Training Program

## 2022-05-05 ENCOUNTER — Emergency Department: Payer: Medicare Other

## 2022-05-05 ENCOUNTER — Other Ambulatory Visit: Payer: Self-pay

## 2022-05-05 DIAGNOSIS — R300 Dysuria: Secondary | ICD-10-CM | POA: Diagnosis not present

## 2022-05-05 DIAGNOSIS — F419 Anxiety disorder, unspecified: Secondary | ICD-10-CM | POA: Insufficient documentation

## 2022-05-05 DIAGNOSIS — R0600 Dyspnea, unspecified: Secondary | ICD-10-CM | POA: Diagnosis not present

## 2022-05-05 DIAGNOSIS — R0602 Shortness of breath: Secondary | ICD-10-CM | POA: Diagnosis present

## 2022-05-05 LAB — BASIC METABOLIC PANEL
Anion gap: 10 (ref 5–15)
BUN: 12 mg/dL (ref 6–20)
CO2: 20 mmol/L — ABNORMAL LOW (ref 22–32)
Calcium: 9.5 mg/dL (ref 8.9–10.3)
Chloride: 106 mmol/L (ref 98–111)
Creatinine, Ser: 0.59 mg/dL (ref 0.44–1.00)
GFR, Estimated: >60 mL/min (ref >60)
Glucose, Bld: 114 mg/dL — ABNORMAL HIGH (ref 70–99)
Potassium: 4.4 mmol/L (ref 3.5–5.1)
Sodium: 136 mmol/L (ref 135–145)

## 2022-05-05 LAB — URINALYSIS, ROUTINE W REFLEX MICROSCOPIC
Bilirubin Urine: NEGATIVE
Glucose, UA: NEGATIVE mg/dL
Hgb urine dipstick: NEGATIVE
Ketones, ur: NEGATIVE mg/dL
Leukocytes,Ua: NEGATIVE
Nitrite: NEGATIVE
Protein, ur: NEGATIVE mg/dL
Specific Gravity, Urine: 1.008 (ref 1.005–1.030)
pH: 5 (ref 5.0–8.0)

## 2022-05-05 LAB — CBC
HCT: 46.8 % — ABNORMAL HIGH (ref 36.0–46.0)
Hemoglobin: 15.7 g/dL — ABNORMAL HIGH (ref 12.0–15.0)
MCH: 27.7 pg (ref 26.0–34.0)
MCHC: 33.5 g/dL (ref 30.0–36.0)
MCV: 82.7 fL (ref 80.0–100.0)
Platelets: 304 10*3/uL (ref 150–400)
RBC: 5.66 MIL/uL — ABNORMAL HIGH (ref 3.87–5.11)
RDW: 13.3 % (ref 11.5–15.5)
WBC: 10.5 10*3/uL (ref 4.0–10.5)
nRBC: 0 % (ref 0.0–0.2)

## 2022-05-05 LAB — TROPONIN I (HIGH SENSITIVITY): Troponin I (High Sensitivity): 3 ng/L (ref <18)

## 2022-05-05 LAB — TSH: TSH: 3.696 u[IU]/mL (ref 0.350–4.500)

## 2022-05-05 MED ORDER — SODIUM CHLORIDE 0.9 % IV BOLUS
500.0000 mL | Freq: Once | INTRAVENOUS | Status: AC
  Administered 2022-05-05: 500 mL via INTRAVENOUS

## 2022-05-05 MED ORDER — LORAZEPAM 1 MG PO TABS
1.0000 mg | ORAL_TABLET | Freq: Once | ORAL | Status: AC
  Administered 2022-05-05: 0.5 mg via ORAL

## 2022-05-05 MED ORDER — DIAZEPAM 5 MG PO TABS
5.0000 mg | ORAL_TABLET | Freq: Once | ORAL | Status: AC
  Administered 2022-05-05: 5 mg via ORAL
  Filled 2022-05-05: qty 1

## 2022-05-05 MED ORDER — LORAZEPAM 0.5 MG PO TABS
0.5000 mg | ORAL_TABLET | Freq: Once | ORAL | Status: DC
  Filled 2022-05-05: qty 1

## 2022-05-05 MED ORDER — IPRATROPIUM-ALBUTEROL 0.5-2.5 (3) MG/3ML IN SOLN
3.0000 mL | Freq: Once | RESPIRATORY_TRACT | Status: AC
  Administered 2022-05-05: 3 mL via RESPIRATORY_TRACT
  Filled 2022-05-05: qty 3

## 2022-05-05 MED ORDER — LORAZEPAM 0.5 MG PO TABS: 0.5000 mg | ORAL_TABLET | Freq: Three times a day (TID) | ORAL | 0 refills | Status: DC | PRN

## 2022-05-05 NOTE — ED Triage Notes (Signed)
Pt here with SOB and spasms. Pt states every time she ambulates her HR increases to 130s. Pt states she is spasming "in her females". Pt also having trouble sleeping and has been picking sores in her head to the "stress".

## 2022-05-05 NOTE — Consult Note (Signed)
[facility] Consult   Reason for Consult:  chronic anxiety Referring Physician:  Nigel Bridgeman Patient Identification: Veronica Rangel MRN:  [id number] Principal Diagnosis: Anxiety disorder Diagnosis:  Principal Problem:   Anxiety disorder   Total Time spent with patient: 45 minutes  Subjective: "I don't want to have to keep coming here"   Kaicee Scarpino is a 56 y.o. female patient admitted with SOB/anxiety.  HPI:  Patient has been seen in this ED several times over the last several weeks for symptoms Shortness of breath and anxiety/inability to relax. Patient reports history of chronic anxiety, sometimes mixed with depression since 72-years of age. She reports having a grown son who is doing well now, but was very ill as a child. Patient states that she gets so anxious that something bad is going to happen that she just stays in her house and doesn't do anything until her husband gets  home from work. She reports seeing a psychiatrist online who prescribes medications, but not benzodiazepines. She is most recently taking Effexor 225 mg daily, having discontinued Cymbalta because that made her more anxious.    Patient denies suicidal thoughts. No history of suicide attempts and states she has never had suicidal thoughts.  Denies auditory or visual hallucinations. Speaks in coherent, linear sentences.  On evaluation, patient does appear anxious, tearful at times. She says that she is "tired of coming to the hospital" to get medicine for anxiety. She is asking for medication to "get me through until Friday." Patient shows Probation officer that she made an appointment at "[facility]" in [place] (Vance Peper, NP, it is noted in [place] as well). She states she has not been to a primary care provider for a few years and was hoping she could wait until Friday, but the anxiety "got too bad." She sees an online psychiatrist but would like to see someone in person, as well as getting a therapist.  Advised patient that this is an excellent plan.   Patient does not meet criteria for inpatient treatment and she has outpatient providers in place, with the plan of adding mental health providers. Patient is comfortable to discharge    Past Psychiatric History: anxiety Risk to Self:   Risk to Others:   Prior Inpatient Therapy:   Prior Outpatient Therapy:    Past Medical History:  Past Medical History:  Diagnosis Date   Cancer ([place])    renal   Fibromyalgia    RA (rheumatoid arthritis) ([place])     Past Surgical History:  Procedure Laterality Date   ADRENALECTOMY Left    APPENDECTOMY     CESAREAN SECTION     CHOLECYSTECTOMY     NEPHRECTOMY RADICAL     Family History: No family history on file. Family Psychiatric  History:  Social History:  Social History   Substance and Sexual Activity  Alcohol Use Not Currently     Social History   Substance and Sexual Activity  Drug Use Not Currently    Social History   Socioeconomic History   Marital status: Legally Separated    Spouse name: Not on file   Number of children: Not on file   Years of education: Not on file   Highest education level: Not on file  Occupational History   Not on file  Tobacco Use   Smoking status: Every Day    Packs/day: 2.50    Years: 41.00    Total pack years: 102.50    Types: Cigarettes  Start date: 1982   Smokeless tobacco: Never   Tobacco comments:    Currently smoking 2 cigs a day as of 04/28/22 ep  Vaping Use   Vaping Use: Never used  Substance and Sexual Activity   Alcohol use: Not Currently   Drug use: Not Currently   Sexual activity: Not Currently  Other Topics Concern   Not on file  Social History Narrative   Not on file   Social Determinants of Health   Financial Resource Strain: Not on file  Food Insecurity: Not on file  Transportation Needs: Not on file  Physical Activity: Not on file  Stress: Not on file  Social Connections: Not on file   Additional Social  History:    Allergies:   Allergies  Allergen Reactions   Codeine Anaphylaxis   Prednisone    Amoxicillin-Pot Clavulanate Rash    Labs:  Results for orders placed or performed during the hospital encounter of 05/05/22 (from the past 48 hour(s))  CBC     Status: Abnormal   Collection Time: 05/05/22  9:01 AM  Result Value Ref Range   WBC 10.5 4.0 - 10.5 K/uL   RBC 5.66 (H) 3.87 - 5.11 MIL/uL   Hemoglobin 15.7 (H) 12.0 - 15.0 g/dL   HCT 46.8 (H) 36.0 - 46.0 %   MCV 82.7 80.0 - 100.0 fL   MCH 27.7 26.0 - 34.0 pg   MCHC 33.5 30.0 - 36.0 g/dL   RDW 13.3 11.5 - 15.5 %   Platelets 304 150 - 400 K/uL   nRBC 0.0 0.0 - 0.2 %    Comment: Performed at [facility], [address], [place], [place] [address]  Basic metabolic panel     Status: Abnormal   Collection Time: 05/05/22  9:01 AM  Result Value Ref Range   Sodium 136 135 - 145 mmol/L   Potassium 4.4 3.5 - 5.1 mmol/L   Chloride 106 98 - 111 mmol/L   CO2 20 (L) 22 - 32 mmol/L   Glucose, Bld 114 (H) 70 - 99 mg/dL    Comment: Glucose reference range applies only to samples taken after fasting for at least 8 hours.   BUN 12 6 - 20 mg/dL   Creatinine, Ser 0.59 0.44 - 1.00 mg/dL   Calcium 9.5 8.9 - 10.3 mg/dL   GFR, Estimated >60 >60 mL/min    Comment: (NOTE) Calculated using the CKD-EPI Creatinine Equation (2021)    Anion gap 10 5 - 15    Comment: Performed at [facility], [address] [place], [place] [address]  TSH     Status: None   Collection Time: 05/05/22  9:01 AM  Result Value Ref Range   TSH 3.696 0.350 - 4.500 uIU/mL    Comment: Performed by a 3rd Generation assay with a functional sensitivity of <=0.01 uIU/mL. Performed at [facility], [place], [place] [address]   Troponin I (High Sensitivity)     Status: None   Collection Time: 05/05/22  9:01 AM  Result Value Ref Range   Troponin I (High Sensitivity) 3 <18 ng/L    Comment: (NOTE) Elevated high sensitivity  troponin I (hsTnI) values and significant  changes across serial measurements may suggest ACS but many other  chronic and acute conditions are known to elevate hsTnI results.  Refer to the "Links" section for chest pain algorithms and additional  guidance. Performed at [facility], [address] [place], [place] [address]  Urinalysis, Routine w reflex microscopic Urine, Clean Catch     Status: Abnormal   Collection Time: 05/05/22  9:46 AM  Result Value Ref Range   Color, Urine STRAW (A) YELLOW   APPearance CLEAR (A) CLEAR   Specific Gravity, Urine 1.008 1.005 - 1.030   pH 5.0 5.0 - 8.0   Glucose, UA NEGATIVE NEGATIVE mg/dL   Hgb urine dipstick NEGATIVE NEGATIVE   Bilirubin Urine NEGATIVE NEGATIVE   Ketones, ur NEGATIVE NEGATIVE mg/dL   Protein, ur NEGATIVE NEGATIVE mg/dL   Nitrite NEGATIVE NEGATIVE   Leukocytes,Ua NEGATIVE NEGATIVE    Comment: Performed at [facility], [address] [place], [place] [address]    No current facility-administered medications for this encounter.   Current Outpatient Medications  Medication Sig Dispense Refill   albuterol (VENTOLIN HFA) 108 (90 Base) MCG/ACT inhaler Inhale 2 puffs into the lungs every 4 (four) hours as needed for wheezing or shortness of breath. 1 each 1   LORazepam (ATIVAN) 0.5 MG tablet Take 1 tablet (0.5 mg total) by mouth every 8 (eight) hours as needed for anxiety. 8 tablet 0   omeprazole (PRILOSEC) 20 MG capsule Take 20 mg by mouth daily after supper.     pantoprazole (PROTONIX) 40 MG tablet Take 1 tablet (40 mg total) by mouth daily. Take 30-60 minutes before first meal of the day. 30 tablet 5   predniSONE (DELTASONE) 50 MG tablet Take 1 tablet (50 mg total) by mouth daily with breakfast. 4 tablet 0   Venlafaxine HCl (EFFEXOR PO) Take by mouth.      Musculoskeletal: Strength & Muscle Tone: within normal limits Gait & Station:  to not observe Patient leans: N/A   Psychiatric Specialty  Exam:  Presentation  General Appearance: Appropriate for Environment  Eye Contact:Good  Speech:Clear and Coherent  Speech Volume:Normal  Handedness:No data recorded  Mood and Affect  Mood:Anxious  Affect:Congruent   Thought Process  Thought Processes:Coherent  Descriptions of Associations:Intact  Orientation:Full (Time, Place and Person)  Thought Content:Logical  History of Schizophrenia/Schizoaffective disorder:No data recorded Duration of Psychotic Symptoms:No data recorded Hallucinations:Hallucinations: None  Ideas of Reference:None  Suicidal Thoughts:Suicidal Thoughts: No  Homicidal Thoughts:Homicidal Thoughts: No   Sensorium  Memory:Immediate Good; Recent Good  Judgment:Good  Insight:Good   Executive Functions  Concentration:Good  Attention Span:Good  Gregory of Knowledge:Good  Language:Good   Psychomotor Activity  Psychomotor Activity:Psychomotor Activity: Normal   Assets  Assets:Communication Skills; Desire for Improvement; Financial Resources/Insurance; Housing; Intimacy; Social Support; Resilience   Sleep  Sleep:Sleep: Poor   Physical Exam: Physical Exam Vitals and nursing note reviewed.  HENT:     Head: Normocephalic.     Nose: No congestion or rhinorrhea.  Eyes:     General:        Right eye: No discharge.        Left eye: No discharge.  Pulmonary:     Effort: Pulmonary effort is normal.  Musculoskeletal:        General: Normal range of motion.     Cervical back: Normal range of motion.  Skin:    General: Skin is dry.  Neurological:     Mental Status: She is alert and oriented to person, place, and time.  Psychiatric:        Mood and Affect: Mood is anxious.        Speech: Speech normal.        Behavior: Behavior is cooperative.        Thought Content:  Thought content normal.        Cognition and Memory: Cognition normal.        Judgment: Judgment normal.    Review of Systems  HENT: Negative.     Eyes: Negative.   Gastrointestinal: Negative.   Musculoskeletal:        Hx of fibromyalgia  Psychiatric/Behavioral:  Positive for depression (stable). Negative for hallucinations, memory loss, substance abuse and suicidal ideas. The patient is nervous/anxious and has insomnia.    Blood pressure 138/78, pulse 98, temperature 97.6 F (36.4 C), temperature source Oral, resp. rate 18, height '5\' 4"'$  (1.626 m), weight 82.6 kg, SpO2 98 %. Body mass index is 31.26 kg/m.  Treatment Plan Summary: Patient encouraged to find in-person outpatient therapist, as this is an very important treatment component for her chronic anxiety.  Patient has appointment with primary provider on Friday, 05/07/22, who will arrange for patient to see mental health provider for medication management and therapy. Dr. Quentin Cornwall agreed to write a prescription for lorazepam  for a couple days. Reviewed with Dr. Quentin Cornwall, [place].   Disposition: No evidence of imminent risk to self or others at present.   Supportive therapy provided about ongoing stressors. Discussed crisis plan, support from social network, calling 911, coming to the Emergency Department, and calling Suicide Hotline.  Sherlon Handing, NP 05/05/2022 7:22 PM

## 2022-05-05 NOTE — ED Provider Notes (Signed)
[facility] Provider Note    Event Date/Time   First MD Initiated Contact with Patient 05/05/22 [phone]     (approximate)   History   Shortness of Breath   HPI  Veronica Rangel is a 56 y.o. female history of anxiety fibromyalgia presents to the ER for evaluation of several weeks of persistent shortness of breath particular ambulation.  Was given a course of prednisone for COPD exacerbation.  She states that she had follow-up with pulmonology and was told that she does not have COPD or bronchitis.  States she is feeling very anxious and like her body is falling apart.  She is not sleeping.  Not had anything to eat or drink.  Very tearful.  No fevers feels nauseated because she feels so anxious.  Does admit to some dysuria.     Physical Exam   Triage Vital Signs: ED Triage Vitals  Enc Vitals Group     BP 05/05/22 0900 (!) 136/96     Pulse Rate 05/05/22 0900 (!) 112     Resp 05/05/22 0900 18     Temp 05/05/22 0900 97.6 F (36.4 C)     Temp Source 05/05/22 0900 Oral     SpO2 05/05/22 0900 96 %     Weight 05/05/22 0858 182 lb 1.6 oz (82.6 kg)     Height 05/05/22 0858 '5\' 4"'$  (1.626 m)     Head Circumference --      Peak Flow --      Pain Score 05/05/22 0858 9     Pain Loc --      Pain Edu? --      Excl. in [place]? --     Most recent vital signs: Vitals:   05/05/22 1142 05/05/22 1427  BP: (!) 140/94 138/78  Pulse: (!) 108 98  Resp: 20 18  Temp:    SpO2: 97% 98%     Constitutional: Alert, anxious appearing Eyes: Conjunctivae are normal.  Head: Atraumatic. Nose: No congestion/rhinnorhea. Mouth/Throat: Mucous membranes are moist.   Neck: Painless ROM.  Cardiovascular:   Good peripheral circulation. Mildly tachycardic, no m/g/r Respiratory: Normal respiratory effort.  No retractions. No wheeze Gastrointestinal: Soft and nontender.  Musculoskeletal:  no deformity Neurologic:  MAE spontaneously. No gross focal neurologic deficits are appreciated.  Skin:   Skin is warm, dry and intact. No rash noted. Psychiatric: Mood and affect are normal. Speech and behavior are normal.    ED Results / Procedures / Treatments   Labs (all labs ordered are listed, but only abnormal results are displayed) Labs Reviewed  CBC - Abnormal; Notable for the following components:      Result Value   RBC 5.66 (*)    Hemoglobin 15.7 (*)    HCT 46.8 (*)    All other components within normal limits  BASIC METABOLIC PANEL - Abnormal; Notable for the following components:   CO2 20 (*)    Glucose, Bld 114 (*)    All other components within normal limits  URINALYSIS, ROUTINE W REFLEX MICROSCOPIC - Abnormal; Notable for the following components:   Color, Urine STRAW (*)    APPearance CLEAR (*)    All other components within normal limits  TSH  TROPONIN I (HIGH SENSITIVITY)     EKG  ED ECG REPORT I, Merlyn Lot, the attending physician, personally viewed and interpreted this ECG.   Date: 05/05/2022  EKG Time: 8:59  Rate: 110  Rhythm: sinus  Axis: normal  Intervals:normal intervals  ST&T Change: no stemi, no depressions    RADIOLOGY Please see ED Course for my review and interpretation.  I personally reviewed all radiographic images ordered to evaluate for the above acute complaints and reviewed radiology reports and findings.  These findings were personally discussed with the patient.  Please see medical record for radiology report.    PROCEDURES:  Critical Care performed:   Procedures   MEDICATIONS ORDERED IN ED: Medications  ipratropium-albuterol (DUONEB) 0.5-2.5 (3) MG/3ML nebulizer solution 3 mL (3 mLs Nebulization Given 05/05/22 0927)  sodium chloride 0.9 % bolus 500 mL (0 mLs Intravenous Stopped 05/05/22 1105)  diazepam (VALIUM) tablet 5 mg (5 mg Oral Given 05/05/22 0927)  LORazepam (ATIVAN) tablet 1 mg (0.5 mg Oral Given 05/05/22 1225)     IMPRESSION / MDM / ASSESSMENT AND PLAN / ED COURSE  I reviewed the triage vital signs  and the nursing notes.                              Differential diagnosis includes, but is not limited to, COPD, CHF, anxiety, ACS, PE, anemia, electrolyte abnormality, bronchitis, pneumonia, no other  Asked patient presented to the ER very anxious appearing with symptoms as described above.  Has been seen multiple times for same with extensive work-up here in the ER.  Patient does appear to be having panic attack no hypoxia mild tachycardia will give IV fluids we will give anxiolysis.  I have low suspicion for ACS.  Doubt PE.  Doubt infectious process.  Do not hear any significant wheezing and was just worked up for COPD and not found to have obstructive pathology   Clinical Course as of 05/05/22 1513  Wed May 05, 2022  1250 Patient rocking back and forth states that she is having another panic attack.  States that Ativan works better for her.  She has been evaluated by psychiatry.  Asking if she can be given prescription for additional benzo to make it through until her follow-up with psychiatry on Friday.  Her work-up is otherwise reassuring.  She has been evaluated for PE just recently was all negative.  Do not feel that repeat CT imaging clinically indicated as this is identical to previous presentation. [PR]  1427 Patient troponin negative.  TSH normal urinalysis normal.  Patient feeling improved after anxiolysis.  No SI or HI.  I do believe she stable and appropriate for outpatient follow-up. [PR]    Clinical Course User Index [PR] Merlyn Lot, MD      FINAL CLINICAL IMPRESSION(S) / ED DIAGNOSES   Final diagnoses:  Dyspnea, unspecified type  Anxiety     Rx / DC Orders   ED Discharge Orders          Ordered    LORazepam (ATIVAN) 0.5 MG tablet  Every 8 hours PRN        05/05/22 1357             Note:  This document was prepared using Dragon voice recognition software and may include unintentional dictation errors.    Merlyn Lot, MD 05/05/22 [phone]

## 2022-05-05 NOTE — ED Notes (Signed)
Pt appears to be having an anxiety attack.  States she is "going to pass out" and "that medicine didn't help at all I feel worse"  Encouraged pt to focus on breathing. Advised would make EDP aware of her symptoms.

## 2022-05-06 NOTE — Progress Notes (Signed)
New Patient Office Visit  Subjective    Patient ID: Veronica Rangel, female    DOB: October 25, 1966  Age: 56 y.o. MRN: [id number]  CC:  Chief Complaint  Patient presents with   Establish Care    Np. Est care. Pt c/o worsening panic attacks and hx of fibromyalgia.     HPI Veronica Rangel presents for new patient visit to establish care.  Introduced to Designer, jewellery role and practice setting.  All questions answered.  Discussed provider/patient relationship and expectations.  Veronica Rangel has a history of fibromyalgia and rheumatoid arthritis.  She states that she has been trying different medications, however nothing is helping with her pain.  She is not currently seeing a rheumatologist, however is interested in a referral back.  She also is requesting a referral to pain management.  The rheumatoid arthritis pain is in her hands and her feet.  The fibromyalgia pain is in her arms, back, stomach.  She has tried gabapentin and Lyrica in the past however she did not tolerate this.  She has a history of anxiety and depression that is not controlled right now.  She has been to the ER several times in the past few days for panic attacks.  She states that she has not left her house in a couple of months.  She is worried about various different things including side effects to medications when she is starting them.  She is currently taking venlafaxine 225 mg daily and was prescribed lorazepam 0.5 mg 3 times daily as needed anxiety from the ER.  She is currently seeing a therapist virtually, however she wants to see one in person.  She was changed at one point to Cymbalta to see if would help with her fibromyalgia and anxiety, however it did help with some of her pain but not with the anxiety.  She then went back to Effexor.  She has tried Zoloft, Prozac, Wellbutrin in the past as well.     05/07/2022    2:27 PM  Depression screen PHQ 2/9  Decreased Interest 3  Down, Depressed, Hopeless 2  PHQ - 2 Score 5   Altered sleeping 3  Tired, decreased energy 3  Change in appetite 2  Feeling bad or failure about yourself  1  Trouble concentrating 1  Moving slowly or fidgety/restless 0  Suicidal thoughts 0  PHQ-9 Score 15  Difficult doing work/chores Extremely dIfficult      05/07/2022    2:28 PM  GAD 7 : Generalized Anxiety Score  Nervous, Anxious, on Edge 3  Control/stop worrying 3  Worry too much - different things 3  Trouble relaxing 3  Restless 3  Easily annoyed or irritable 1  Afraid - awful might happen 1  Total GAD 7 Score 17  Anxiety Difficulty Extremely difficult    Outpatient Encounter Medications as of 05/07/2022  Medication Sig   cyclobenzaprine (FLEXERIL) 5 MG tablet Take 1 tablet (5 mg total) by mouth 3 (three) times daily as needed for muscle spasms.   LORazepam (ATIVAN) 0.5 MG tablet Take 1 tablet (0.5 mg total) by mouth every 8 (eight) hours as needed for anxiety.   [DISCONTINUED] cyclobenzaprine (FLEXERIL) 10 MG tablet Take 5 mg by mouth 3 (three) times daily as needed for muscle spasms.   albuterol (VENTOLIN HFA) 108 (90 Base) MCG/ACT inhaler Inhale 2 puffs into the lungs every 4 (four) hours as needed for wheezing or shortness of breath.   omeprazole (PRILOSEC) 20 MG capsule Take 20 mg by  mouth daily after supper.   pantoprazole (PROTONIX) 40 MG tablet Take 1 tablet (40 mg total) by mouth daily. Take 30-60 minutes before first meal of the day.   Venlafaxine HCl (EFFEXOR PO) Take 225 mg by mouth.   [DISCONTINUED] LORazepam (ATIVAN) 0.5 MG tablet Take 1 tablet (0.5 mg total) by mouth every 8 (eight) hours as needed for anxiety.   [DISCONTINUED] predniSONE (DELTASONE) 50 MG tablet Take 1 tablet (50 mg total) by mouth daily with breakfast.   No facility-administered encounter medications on file as of 05/07/2022.    Past Medical History:  Diagnosis Date   Anxiety    Cancer ([place])    renal   Depression    Fibromyalgia    RA (rheumatoid arthritis) (HCC)    Shingles      Past Surgical History:  Procedure Laterality Date   ADRENALECTOMY Left    APPENDECTOMY     CESAREAN SECTION     CHOLECYSTECTOMY     NEPHRECTOMY RADICAL      Family History  Problem Relation Age of Onset   Cancer Father        lung   Heart disease Maternal Grandfather     Social History   Socioeconomic History   Marital status: Married    Spouse name: Not on file   Number of children: Not on file   Years of education: Not on file   Highest education level: Not on file  Occupational History   Not on file  Tobacco Use   Smoking status: Every Day    Packs/day: 0.25    Years: 41.00    Total pack years: 10.25    Types: Cigarettes    Start date: 1982   Smokeless tobacco: Never   Tobacco comments:    Currently smoking 2 cigs a day as of 04/28/22 ep  Vaping Use   Vaping Use: Never used  Substance and Sexual Activity   Alcohol use: Not Currently   Drug use: Not Currently   Sexual activity: Not Currently  Other Topics Concern   Not on file  Social History Narrative   Not on file   Social Determinants of Health   Financial Resource Strain: Not on file  Food Insecurity: Not on file  Transportation Needs: Not on file  Physical Activity: Not on file  Stress: Not on file  Social Connections: Not on file  Intimate Partner Violence: Not on file    Review of Systems  Constitutional:  Positive for malaise/fatigue. Negative for fever.  HENT: Negative.    Eyes: Negative.   Respiratory: Negative.    Cardiovascular: Negative.   Gastrointestinal:  Positive for abdominal pain (burning and cramping since shingles).  Genitourinary: Negative.   Musculoskeletal:  Positive for joint pain and myalgias.  Skin: Negative.   Neurological: Negative.   Psychiatric/Behavioral:  Positive for depression. The patient is nervous/anxious and has insomnia.     Objective    BP 130/86 (BP Location: Right Arm, Cuff Size: Large)   Pulse (!) 105   Temp (!) 97.4 F (36.3 C) (Temporal)    Ht '5\' 4"'$  (1.626 m)   Wt 186 lb 3.2 oz (84.5 kg)   SpO2 98%   BMI 31.96 kg/m   Physical Exam Vitals and nursing note reviewed.  Constitutional:      General: She is not in acute distress.    Appearance: Normal appearance.  HENT:     Head: Normocephalic.  Eyes:     Conjunctiva/sclera: Conjunctivae normal.  Cardiovascular:  Rate and Rhythm: Normal rate and regular rhythm.     Pulses: Normal pulses.     Heart sounds: Normal heart sounds.  Pulmonary:     Effort: Pulmonary effort is normal.     Breath sounds: Normal breath sounds.  Abdominal:     Palpations: Abdomen is soft.     Tenderness: There is no abdominal tenderness.  Musculoskeletal:     Cervical back: Normal range of motion.     Right lower leg: No edema.     Left lower leg: No edema.  Skin:    General: Skin is warm.  Neurological:     General: No focal deficit present.     Mental Status: She is alert and oriented to person, place, and time.  Psychiatric:        Mood and Affect: Mood normal.        Behavior: Behavior normal.        Thought Content: Thought content normal.        Judgment: Judgment normal.     Last CBC Lab Results  Component Value Date   WBC 10.5 05/05/2022   HGB 15.7 (H) 05/05/2022   HCT 46.8 (H) 05/05/2022   MCV 82.7 05/05/2022   MCH 27.7 05/05/2022   RDW 13.3 05/05/2022   PLT 304 59/56/3875   Last metabolic panel Lab Results  Component Value Date   GLUCOSE 114 (H) 05/05/2022   NA 136 05/05/2022   K 4.4 05/05/2022   CL 106 05/05/2022   CO2 20 (L) 05/05/2022   BUN 12 05/05/2022   CREATININE 0.59 05/05/2022   GFRNONAA >60 05/05/2022   CALCIUM 9.5 05/05/2022   ANIONGAP 10 05/05/2022   Last thyroid functions Lab Results  Component Value Date   TSH 3.696 05/05/2022     Assessment & Plan:   Problem List Items Addressed This Visit       Musculoskeletal and Integument   Rheumatoid arthritis involving both hands (HCC)    Chronic, with ongoing pain.  She has rheumatoid  arthritis to both hands and feet.  We will place referral to rheumatology for possible treatment.      Relevant Medications   cyclobenzaprine (FLEXERIL) 5 MG tablet   Other Relevant Orders   Ambulatory referral to Rheumatology   Ambulatory referral to Pain Clinic     Other   Anxiety disorder - Primary    Chronic, not controlled.  She has a history of significant depression and anxiety that is causing her not to be able to leave her house.  She worries about many different things, including her health, side effects to medications, keeping her house clean.  Her GAD-7 today is a 17.  As mentioned above, she has tried Effexor, Cymbalta, Zoloft, Prozac, and Wellbutrin in the past and has found that the venlafaxine has been the most helpful.  She has also been to the ER recently several times with frequent panic attacks.  We will continue her venlafaxine 225 mg daily and lorazepam 0.5 mg 3 times a day as needed.  Referral placed to psychiatry.  Follow-up in 4 weeks or sooner with concerns.      Relevant Medications   LORazepam (ATIVAN) 0.5 MG tablet   Other Relevant Orders   Ambulatory referral to Psychiatry   Depression, major, single episode, moderate (HCC)    Chronic, not controlled.  She has significant depression and anxiety.  She has not been able to leave her house in the past few months except to go to  doctors.  She states that she has OCD like tendencies as well.  She has tried Effexor, Cymbalta, Zoloft, Prozac, and Wellbutrin in the past.  She is currently taking venlafaxine 225 mg daily which is only somewhat helping her symptoms.  Her PHQ-9 is a 15 today.  She denies SI/HI.  We will continue venlafaxine 225 mg daily for now and place referral to psychiatry.      Relevant Medications   LORazepam (ATIVAN) 0.5 MG tablet   Other Relevant Orders   Ambulatory referral to Psychiatry   Fibromyalgia    Chronic, ongoing.  She is having a lot of fibromyalgia pain, along with nerve pain from  shingles infection.  She did not tolerate gabapentin or Lyrica.  She was switched from her venlafaxine to Cymbalta to see if this would help with her pain, which it did, however it did not help with her anxiety or depression at all.  She is back now on her venlafaxine 225 mg daily.  We will place referral to pain management.      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  She had a radical nephrectomy and her adrenal removed.  Currently not following with oncology anymore.      Tobacco use    She has currently decreased smoking from about 2 packs a day down to 3 cigarettes/day.  Congratulated her on this!  She is still looking to cut back down completely.  She is thinking about using nicotine patches.  Reach out if she would like a prescription for this.       Return in about 4 weeks (around 06/04/2022) for Anxiety, Depression.   Charyl Dancer, NP

## 2022-05-07 ENCOUNTER — Ambulatory Visit (INDEPENDENT_AMBULATORY_CARE_PROVIDER_SITE_OTHER): Payer: Medicare Other | Admitting: Nurse Practitioner

## 2022-05-07 ENCOUNTER — Encounter: Payer: Self-pay | Admitting: Nurse Practitioner

## 2022-05-07 VITALS — BP 130/86 | HR 105 | Temp 97.4°F | Ht 64.0 in | Wt 186.2 lb

## 2022-05-07 DIAGNOSIS — F321 Major depressive disorder, single episode, moderate: Secondary | ICD-10-CM | POA: Diagnosis not present

## 2022-05-07 DIAGNOSIS — Z85528 Personal history of other malignant neoplasm of kidney: Secondary | ICD-10-CM | POA: Insufficient documentation

## 2022-05-07 DIAGNOSIS — M069 Rheumatoid arthritis, unspecified: Secondary | ICD-10-CM | POA: Diagnosis not present

## 2022-05-07 DIAGNOSIS — F411 Generalized anxiety disorder: Secondary | ICD-10-CM | POA: Diagnosis not present

## 2022-05-07 DIAGNOSIS — Z720 Tobacco use: Secondary | ICD-10-CM

## 2022-05-07 DIAGNOSIS — M797 Fibromyalgia: Secondary | ICD-10-CM | POA: Insufficient documentation

## 2022-05-07 MED ORDER — CYCLOBENZAPRINE HCL 5 MG PO TABS: 5.0000 mg | ORAL_TABLET | Freq: Three times a day (TID) | ORAL | 1 refills | Status: DC | PRN

## 2022-05-07 MED ORDER — LORAZEPAM 0.5 MG PO TABS: 0.5000 mg | ORAL_TABLET | Freq: Three times a day (TID) | ORAL | 0 refills | Status: DC | PRN

## 2022-05-07 NOTE — Assessment & Plan Note (Signed)
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  She had a radical nephrectomy and her adrenal removed.  Currently not following with oncology anymore.

## 2022-05-07 NOTE — Assessment & Plan Note (Signed)
Chronic, not controlled.  She has significant depression and anxiety.  She has not been able to leave her house in the past few months except to go to doctors.  She states that she has OCD like tendencies as well.  She has tried Effexor, Cymbalta, Zoloft, Prozac, and Wellbutrin in the past.  She is currently taking venlafaxine 225 mg daily which is only somewhat helping her symptoms.  Her PHQ-9 is a 15 today.  She denies SI/HI.  We will continue venlafaxine 225 mg daily for now and place referral to psychiatry.

## 2022-05-07 NOTE — Assessment & Plan Note (Signed)
Chronic, not controlled.  She has a history of significant depression and anxiety that is causing her not to be able to leave her house.  She worries about many different things, including her health, side effects to medications, keeping her house clean.  Her GAD-7 today is a 17.  As mentioned above, she has tried Effexor, Cymbalta, Zoloft, Prozac, and Wellbutrin in the past and has found that the venlafaxine has been the most helpful.  She has also been to the ER recently several times with frequent panic attacks.  We will continue her venlafaxine 225 mg daily and lorazepam 0.5 mg 3 times a day as needed.  Referral placed to psychiatry.  Follow-up in 4 weeks or sooner with concerns.

## 2022-05-07 NOTE — Assessment & Plan Note (Signed)
Chronic, with ongoing pain.  She has rheumatoid arthritis to both hands and feet.  We will place referral to rheumatology for possible treatment.

## 2022-05-07 NOTE — Patient Instructions (Signed)
It was great to see you!  I have refilled your flexeril and lorazepam, do not take these together. I have placed referrals to psychiatry, pain clinic, and rheumatology. Continue your venlafaxine daily.   Let's follow-up in 4 weeks, sooner if you have concerns.  If a referral was placed today, you will be contacted for an appointment. Please note that routine referrals can sometimes take up to 3-4 weeks to process. Please call our office if you haven't heard anything after this time frame.  Take care,  Vance Peper, NP

## 2022-05-07 NOTE — Assessment & Plan Note (Signed)
She has currently decreased smoking from about 2 packs a day down to 3 cigarettes/day.  Congratulated her on this!  She is still looking to cut back down completely.  She is thinking about using nicotine patches.  Reach out if she would like a prescription for this.

## 2022-05-07 NOTE — Assessment & Plan Note (Signed)
Chronic, ongoing.  She is having a lot of fibromyalgia pain, along with nerve pain from shingles infection.  She did not tolerate gabapentin or Lyrica.  She was switched from her venlafaxine to Cymbalta to see if this would help with her pain, which it did, however it did not help with her anxiety or depression at all.  She is back now on her venlafaxine 225 mg daily.  We will place referral to pain management.

## 2022-05-19 ENCOUNTER — Telehealth: Payer: Self-pay | Admitting: Nurse Practitioner

## 2022-05-19 NOTE — Telephone Encounter (Signed)
Pt called and stated she been dizzy and light headed and wanted to know if anything over the counter she can take

## 2022-05-19 NOTE — Telephone Encounter (Signed)
Called and spoke to pat, pt stated she is experiencing dizziness and lightheadedness w/ balance  issues for several days, but not severe enough to need immediate care. Pt okay w/ moving up appt. Scheduled pt for appt 05/27/22 '@2p'$ . Pt also advised to go to Urgent care or ER if symptoms get worse. Sw cma

## 2022-05-22 ENCOUNTER — Other Ambulatory Visit: Payer: Self-pay | Admitting: Nurse Practitioner

## 2022-05-23 ENCOUNTER — Other Ambulatory Visit: Payer: Self-pay | Admitting: Nurse Practitioner

## 2022-05-24 ENCOUNTER — Encounter: Payer: Self-pay | Admitting: Nurse Practitioner

## 2022-05-24 ENCOUNTER — Telehealth: Payer: Self-pay | Admitting: Nurse Practitioner

## 2022-05-24 NOTE — Telephone Encounter (Signed)
  Encourage patient to contact the pharmacy for refills or they can request refills through [facility]:  Please schedule appointment if longer than 1 year  NEXT APPOINTMENT DATE:  MEDICATION: LORazepam (ATIVAN) 0.5 MG tablet  Is the patient out of medication?   PHARMACY:  [facility] [id number] - [address], [place] Phone:  [phone]         Let patient know to contact pharmacy at the end of the day to make sure medication is ready.  Please notify patient to allow 48-72 hours to process  CLINICAL FILLS OUT ALL BELOW:   LAST REFILL:  QTY:  REFILL DATE:    OTHER COMMENTS:    Okay for refill?  Please advise

## 2022-05-24 NOTE — Progress Notes (Unsigned)
   Established Patient Office Visit  Subjective   Patient ID: Veronica Rangel, female    DOB: 1966-01-28  Age: 56 y.o. MRN: [id number]  No chief complaint on file.   HPI  [name] is here to follow-up on anxiety, depression, and light-headedness.   {History (Optional):23778}  ROS    Objective:     There were no vitals taken for this visit. {Vitals History (Optional):23777}  Physical Exam   No results found for any visits on 05/27/22.  {Labs (Optional):23779}  The ASCVD Risk score (Arnett DK, et al., 2019) failed to calculate for the following reasons:   Cannot find a previous HDL lab   Cannot find a previous total cholesterol lab    Assessment & Plan:   Problem List Items Addressed This Visit   None   No follow-ups on file.    Charyl Dancer, NP

## 2022-05-24 NOTE — Telephone Encounter (Signed)
Called and spoke to pt to clarify how she is taking Lorazepam since she should not be out of pills. Pt states she doubled up on doses to equal '1mg'$  due to having difficulty sleeping and to prevent panic attacks. Explain to pt I will forward information to pcp for further determination. Sw, cma

## 2022-05-27 ENCOUNTER — Encounter: Payer: Self-pay | Admitting: Nurse Practitioner

## 2022-05-27 ENCOUNTER — Ambulatory Visit (INDEPENDENT_AMBULATORY_CARE_PROVIDER_SITE_OTHER): Payer: Medicare Other | Admitting: Nurse Practitioner

## 2022-05-27 VITALS — BP 130/82 | HR 114 | Temp 96.0°F | Ht 64.0 in

## 2022-05-27 DIAGNOSIS — R0602 Shortness of breath: Secondary | ICD-10-CM

## 2022-05-27 DIAGNOSIS — J449 Chronic obstructive pulmonary disease, unspecified: Secondary | ICD-10-CM | POA: Diagnosis not present

## 2022-05-27 DIAGNOSIS — T148XXA Other injury of unspecified body region, initial encounter: Secondary | ICD-10-CM

## 2022-05-27 DIAGNOSIS — R079 Chest pain, unspecified: Secondary | ICD-10-CM

## 2022-05-27 DIAGNOSIS — F411 Generalized anxiety disorder: Secondary | ICD-10-CM

## 2022-05-27 MED ORDER — LORAZEPAM 0.5 MG PO TABS: 0.5000 mg | ORAL_TABLET | ORAL | 0 refills | Status: DC | PRN

## 2022-05-27 MED ORDER — MUPIROCIN CALCIUM 2 % EX CREA: 1.0000 | TOPICAL_CREAM | Freq: Two times a day (BID) | CUTANEOUS | 0 refills | Status: DC

## 2022-05-27 NOTE — Progress Notes (Signed)
EKG interpreted by me on 05/27/22 showed sinus tachycardia with a heart rate of 109. No ST or T wave changes noted

## 2022-05-27 NOTE — Assessment & Plan Note (Signed)
Chronic, not controlled.  She has not yet heard from a psychiatrist in [place] to set up an appointment.  She had self increased her Ativan and was taking it 4 times a day as needed instead of 3.  Discussed that she needs to call and speak with her provider before self increasing any medications.  She agreed and that she will call next time if she needs a dose adjustment on her medications.  PDMP reviewed.  We will continue venlafaxine and Ativan 0.5 mg twice a day and 1 mg at bedtime as needed.  Follow-up in 3 weeks.

## 2022-05-27 NOTE — Assessment & Plan Note (Signed)
She is still having ongoing intermittent right-sided chest pain and shortness of breath.  Unsure how much of this is related to anxiety.  EKG done in office today shows sinus tachycardia with a heart rate of 109, no ST or T wave changes.  We will check BNP with shortness of breath.  She does have a prescription for Inderal '10mg'$  at home and I encouraged her to take a dose of this when she gets home and 3 times a day as needed.  Referral placed to cardiology.  Follow-up in 3 weeks.

## 2022-05-27 NOTE — Assessment & Plan Note (Signed)
She recently saw pulmonology who does not think her level of COPD is causing the shortness of breath. May be an anxiety component. They are doing further work-up due to possible interstitial lung disease noted on CT scan. No evidence of PE in 03/2022.

## 2022-05-27 NOTE — Assessment & Plan Note (Signed)
She is still experiencing intermittent right-sided chest pain and shortness of breath, especially when she lays down.  She had a CT angio in 03/2022 which was negative for PE and showed possible interstitial lung disease.  She is currently following with pulmonology.  She is having tachycardia and diaphoresis at times.  We will check BMP, CBC, BNP, iron panel.  She does have a prescription for Inderal 10 mg as needed at home, encouraged her to start this to help with her tachycardia.  Follow-up in 3 weeks.  Referral placed to cardiology.

## 2022-05-27 NOTE — Patient Instructions (Addendum)
It was great to see you!  Start ativan 1 tablet in the morning, 1 tablet at lunch, and 2 at bedtime.  Your EKG looks similar to prior EKGs which is reassuring. I have placed a referral to cardiology. Start inderal 3 times a day as needed for anxiety, elevated heart rate.   Start flonase nasal spray daily   Start bactroban ointment twice a day to your abdomen wound  Let's follow-up in 3 weeks, sooner if you have concerns.  If a referral was placed today, you will be contacted for an appointment. Please note that routine referrals can sometimes take up to 3-4 weeks to process. Please call our office if you haven't heard anything after this time frame.  Take care,  Vance Peper, NP

## 2022-05-28 LAB — CBC WITH DIFFERENTIAL/PLATELET
Basophils Absolute: 0.1 10*3/uL (ref 0.0–0.1)
Basophils Relative: 1.1 % (ref 0.0–3.0)
Eosinophils Absolute: 0.1 10*3/uL (ref 0.0–0.7)
Eosinophils Relative: 1.1 % (ref 0.0–5.0)
HCT: 43.7 % (ref 36.0–46.0)
Hemoglobin: 14.8 g/dL (ref 12.0–15.0)
Lymphocytes Relative: 31.3 % (ref 12.0–46.0)
Lymphs Abs: 2.9 10*3/uL (ref 0.7–4.0)
MCHC: 33.8 g/dL (ref 30.0–36.0)
MCV: 83.9 fl (ref 78.0–100.0)
Monocytes Absolute: 0.9 10*3/uL (ref 0.1–1.0)
Monocytes Relative: 9.6 % (ref 3.0–12.0)
Neutro Abs: 5.3 10*3/uL (ref 1.4–7.7)
Neutrophils Relative %: 56.9 % (ref 43.0–77.0)
Platelets: 288 10*3/uL (ref 150.0–400.0)
RBC: 5.21 Mil/uL — ABNORMAL HIGH (ref 3.87–5.11)
RDW: 15.2 % (ref 11.5–15.5)
WBC: 9.3 10*3/uL (ref 4.0–10.5)

## 2022-05-28 LAB — IRON,TIBC AND FERRITIN PANEL
%SAT: 26 % (calc) (ref 16–45)
Ferritin: 32 ng/mL (ref 16–232)
Iron: 107 ug/dL (ref 45–160)
TIBC: 405 mcg/dL (calc) (ref 250–450)

## 2022-05-28 LAB — BASIC METABOLIC PANEL
BUN: 11 mg/dL (ref 6–23)
CO2: 22 mEq/L (ref 19–32)
Calcium: 9.6 mg/dL (ref 8.4–10.5)
Chloride: 104 mEq/L (ref 96–112)
Creatinine, Ser: 0.7 mg/dL (ref 0.40–1.20)
GFR: 95.42 mL/min (ref >60.00)
Glucose, Bld: 120 mg/dL — ABNORMAL HIGH (ref 70–99)
Potassium: 4.3 mEq/L (ref 3.5–5.1)
Sodium: 137 mEq/L (ref 135–145)

## 2022-05-28 LAB — BRAIN NATRIURETIC PEPTIDE: Pro B Natriuretic peptide (BNP): 13 pg/mL (ref 0.0–100.0)

## 2022-06-04 ENCOUNTER — Ambulatory Visit: Payer: Medicare Other | Admitting: Nurse Practitioner

## 2022-06-09 ENCOUNTER — Encounter: Payer: Self-pay | Admitting: Nurse Practitioner

## 2022-06-18 ENCOUNTER — Ambulatory Visit: Payer: Medicare Other | Admitting: Internal Medicine

## 2022-06-20 NOTE — Progress Notes (Deleted)
Cardiology Office Note:    Date:  06/20/2022   ID:  Veronica Rangel, DOB 1966/03/27, MRN [id number]  PCP:  Charyl Dancer, NP  Cardiologist:  None   Referring MD: Charyl Dancer, NP   No chief complaint on file.   History of Present Illness:    Veronica Rangel is a 56 y.o. female with a hx of tobacco use, COPD, Rheumatoid arthritis, anxiety, and recent chest pain.  Referred for evaluation of CP and dyspnea..   ***  Past Medical History:  Diagnosis Date   Anxiety    Cancer ([place])    renal   Depression    Fibromyalgia    RA (rheumatoid arthritis) ([place])    Shingles     Past Surgical History:  Procedure Laterality Date   ADRENALECTOMY Left    APPENDECTOMY     CESAREAN SECTION     CHOLECYSTECTOMY     NEPHRECTOMY RADICAL      Current Medications: No outpatient medications have been marked as taking for the 06/21/22 encounter (Appointment) with Belva Crome, MD.     Allergies:   Codeine, Prednisone, and Amoxicillin-pot clavulanate   Social History   Socioeconomic History   Marital status: Married    Spouse name: Not on file   Number of children: Not on file   Years of education: Not on file   Highest education level: Not on file  Occupational History   Not on file  Tobacco Use   Smoking status: Every Day    Packs/day: 0.25    Years: 41.00    Total pack years: 10.25    Types: Cigarettes    Start date: 1982   Smokeless tobacco: Never   Tobacco comments:    Currently smoking 2 cigs a day as of 04/28/22 ep  Vaping Use   Vaping Use: Never used  Substance and Sexual Activity   Alcohol use: Not Currently   Drug use: Not Currently   Sexual activity: Not Currently  Other Topics Concern   Not on file  Social History Narrative   Not on file   Social Determinants of Health   Financial Resource Strain: Not on file  Food Insecurity: Not on file  Transportation Needs: Not on file  Physical Activity: Not on file  Stress: Not on file  Social Connections: Not  on file     Family History: The patient's family history includes Cancer in her father; Heart disease in her maternal grandfather.  ROS:   Please see the history of present illness.    *** All other systems reviewed and are negative.  EKGs/Labs/Other Studies Reviewed:    The following studies were reviewed today: ***  EKG:  EKG ***  Recent Labs: 05/05/2022: TSH 3.696 05/27/2022: BUN 11; Creatinine, Ser 0.70; Hemoglobin 14.8; Platelets 288.0; Potassium 4.3; Pro B Natriuretic peptide (BNP) 13.0; Sodium 137  Recent Lipid Panel No results found for: "CHOL", "TRIG", "HDL", "CHOLHDL", "VLDL", "LDLCALC", "LDLDIRECT"  Physical Exam:    VS:  There were no vitals taken for this visit.    Wt Readings from Last 3 Encounters:  05/07/22 186 lb 3.2 oz (84.5 kg)  05/05/22 182 lb 1.6 oz (82.6 kg)  04/28/22 182 lb (82.6 kg)     GEN: ***. No acute distress HEENT: Normal NECK: No JVD. LYMPHATICS: No lymphadenopathy CARDIAC: *** murmur. RRR *** gallop, or edema. VASCULAR: *** Normal Pulses. No bruits. RESPIRATORY:  Clear to auscultation without rales, wheezing or rhonchi  ABDOMEN: Soft, non-tender, non-distended, No pulsatile  mass, MUSCULOSKELETAL: No deformity  SKIN: Warm and dry NEUROLOGIC:  Alert and oriented x 3 PSYCHIATRIC:  Normal affect   ASSESSMENT:    1. Chest pain, unspecified type   2. Cigarette smoker   3. Rheumatoid arthritis involving both hands, unspecified whether rheumatoid factor present ([place])    PLAN:    In order of problems listed above:  ***   Medication Adjustments/Labs and Tests Ordered: Current medicines are reviewed at length with the patient today.  Concerns regarding medicines are outlined above.  No orders of the defined types were placed in this encounter.  No orders of the defined types were placed in this encounter.   There are no Patient Instructions on file for this visit.   Signed, Sinclair Grooms, MD  06/20/2022 8:06 PM    [facility]

## 2022-06-21 ENCOUNTER — Ambulatory Visit: Payer: Medicare Other | Admitting: Interventional Cardiology

## 2022-06-21 ENCOUNTER — Encounter: Payer: Self-pay | Admitting: Nurse Practitioner

## 2022-06-21 DIAGNOSIS — M069 Rheumatoid arthritis, unspecified: Secondary | ICD-10-CM

## 2022-06-21 DIAGNOSIS — R079 Chest pain, unspecified: Secondary | ICD-10-CM

## 2022-06-21 DIAGNOSIS — F17210 Nicotine dependence, cigarettes, uncomplicated: Secondary | ICD-10-CM

## 2022-06-28 ENCOUNTER — Other Ambulatory Visit: Payer: Self-pay | Admitting: Nurse Practitioner

## 2022-06-28 MED ORDER — CYCLOBENZAPRINE HCL 10 MG PO TABS: 10.0000 mg | ORAL_TABLET | Freq: Three times a day (TID) | ORAL | 1 refills | Status: DC | PRN

## 2022-06-28 MED ORDER — LORAZEPAM 0.5 MG PO TABS: 0.5000 mg | ORAL_TABLET | ORAL | 0 refills | Status: DC | PRN

## 2022-06-28 NOTE — Telephone Encounter (Signed)
Caller Name: [name] Call back phone #: [phone]  Reason for Call: Asked for Veronica Rangel to please give her a call back, had questions regarding medication refill

## 2022-06-29 ENCOUNTER — Ambulatory Visit: Payer: Medicare Other | Admitting: Internal Medicine

## 2022-06-29 NOTE — Progress Notes (Deleted)
Veronica Rangel, female    DOB: Sep 12, 1966   MRN: [id number]   Brief patient profile:  26  yowf active smoker carries dx of RA/fibromyalgia with tendency to sinus problems  surgery  age 56 or 51 and eventually improved referred to pulmonary clinic 04/28/2022 by [facility]  for sob         History of Present Illness  04/28/2022  Pulmonary/ 1st office eval/Reda Gettis  Chief Complaint  Patient presents with   Consult    Pt states she has been having complaints of SOB. States she has also been told that she had COPD.  Dyspnea:  walked until  a few months prior to OV  /  20 minutes with hills dog walking  Cough: none though aware of pnds watery Sleep: ok after ativan  2 pillows / bed is flat  SABA use: albuterol  for tightness  seems to help Rec The key is to stop smoking completely before smoking completely stops you! - it's not too late! For drainage / throat tickle try take CHLORPHENIRAMINE  4 mg   Pantoprazole (protonix) 40 mg   Take  30-60 min before first meal of the day and Pepcid (famotidine)  20 mg after supper until return to office GERD diet  To get the most out of exercise, you need to be continuously aware that you are short of breath, but never out of breath,build up to 30 minutes daily.   Please schedule a follow up office visit in 6 weeks, call sooner if needed [facility] clinic is fine or here   06/29/2022  f/u ov/Carlise Stofer re: ***   maint on ***  No chief complaint on file.   Dyspnea:  *** Cough: *** Sleeping: *** SABA use: *** 02: *** Covid status:   ***   No obvious day to day or daytime variability or assoc excess/ purulent sputum or mucus plugs or hemoptysis or cp or chest tightness, subjective wheeze or overt sinus or hb symptoms.   *** without nocturnal  or early am exacerbation  of respiratory  c/o's or need for noct saba. Also denies any obvious fluctuation of symptoms with weather or environmental changes or other aggravating or alleviating factors except as outlined above    No unusual exposure hx or h/o childhood pna/ asthma or knowledge of premature birth.  Current Allergies, Complete Past Medical History, Past Surgical History, Family History, and Social History were reviewed in [employer] record.  ROS  The following are not active complaints unless bolded Hoarseness, sore throat, dysphagia, dental problems, itching, sneezing,  nasal congestion or discharge of excess mucus or purulent secretions, ear ache,   fever, chills, sweats, unintended wt loss or wt gain, classically pleuritic or exertional cp,  orthopnea pnd or arm/hand swelling  or leg swelling, presyncope, palpitations, abdominal pain, anorexia, nausea, vomiting, diarrhea  or change in bowel habits or change in bladder habits, change in stools or change in urine, dysuria, hematuria,  rash, arthralgias, visual complaints, headache, numbness, weakness or ataxia or problems with walking or coordination,  change in mood or  memory.        No outpatient medications have been marked as taking for the 06/29/22 encounter (Appointment) with Tanda Rockers, MD.                   Past Medical History:  Diagnosis Date   Cancer [facility])    renal   Fibromyalgia    RA (rheumatoid arthritis) ([place])  Objective:     Wt Readings from Last 3 Encounters:  05/07/22 186 lb 3.2 oz (84.5 kg)  05/05/22 182 lb 1.6 oz (82.6 kg)  04/28/22 182 lb (82.6 kg)      Vital signs reviewed  06/29/2022  - Note at rest 02 sats  ***% on ***   General appearance:    ***           I personally reviewed images and agree with radiology impression as follows:   Chest CTa 04/11/22   There is no evidence of pulmonary artery embolism. There is no evidence of thoracic aortic dissection.   COPD. There is prominence of interstitial markings in the periphery of both lungs suggesting interstitial lung disease. No focal pulmonary infiltrates are seen. .    Assessment

## 2022-07-07 ENCOUNTER — Ambulatory Visit: Payer: Medicare Other

## 2022-07-13 ENCOUNTER — Ambulatory Visit: Payer: Medicare Other | Admitting: Interventional Cardiology

## 2022-07-14 ENCOUNTER — Encounter: Payer: Self-pay | Admitting: Nurse Practitioner

## 2022-07-14 MED ORDER — PROPRANOLOL HCL 10 MG PO TABS
10.0000 mg | ORAL_TABLET | Freq: Three times a day (TID) | ORAL | 1 refills | Status: DC
Start: 2022-07-14 — End: 2022-12-30

## 2022-07-21 ENCOUNTER — Encounter: Payer: Self-pay | Admitting: Nurse Practitioner

## 2022-07-21 MED ORDER — VENLAFAXINE HCL ER 150 MG PO CP24
150.0000 mg | ORAL_CAPSULE | Freq: Every day | ORAL | 1 refills | Status: DC
Start: 2022-07-21 — End: 2023-01-20

## 2022-07-21 MED ORDER — VENLAFAXINE HCL ER 75 MG PO CP24: 75.0000 mg | ORAL_CAPSULE | Freq: Every day | ORAL | 1 refills | Status: DC

## 2022-07-27 ENCOUNTER — Telehealth: Payer: Self-pay | Admitting: Nurse Practitioner

## 2022-07-27 NOTE — Telephone Encounter (Signed)
Left message for patient to call back and schedule Medicare Annual Wellness Visit (AWV).   Please offer to do virtually or by telephone.  Left office number and my jabber #[phone].  AWVI eligible as of  11/22/2009  Please schedule at anytime with Nurse Health Advisor.   

## 2022-07-28 ENCOUNTER — Telehealth: Payer: Self-pay | Admitting: Nurse Practitioner

## 2022-07-28 ENCOUNTER — Other Ambulatory Visit: Payer: Self-pay | Admitting: Nurse Practitioner

## 2022-07-28 MED ORDER — LORAZEPAM 0.5 MG PO TABS: ORAL_TABLET | ORAL | 0 refills | Status: DC

## 2022-08-02 ENCOUNTER — Other Ambulatory Visit: Payer: Self-pay | Admitting: Nurse Practitioner

## 2022-08-03 ENCOUNTER — Encounter: Payer: Self-pay | Admitting: Nurse Practitioner

## 2022-08-03 MED ORDER — LORAZEPAM 0.5 MG PO TABS: ORAL_TABLET | ORAL | 0 refills | Status: DC

## 2022-08-11 NOTE — Telephone Encounter (Signed)
ERROR

## 2022-09-02 ENCOUNTER — Other Ambulatory Visit: Payer: Self-pay | Admitting: Nurse Practitioner

## 2022-09-03 ENCOUNTER — Encounter: Payer: Self-pay | Admitting: Nurse Practitioner

## 2022-09-06 MED ORDER — LORAZEPAM 0.5 MG PO TABS: ORAL_TABLET | ORAL | 0 refills | Status: DC

## 2022-09-06 NOTE — Telephone Encounter (Signed)
Needs appointment for future refills.

## 2022-09-15 ENCOUNTER — Ambulatory Visit: Payer: Medicare Other

## 2022-09-15 ENCOUNTER — Telehealth: Payer: Self-pay

## 2022-09-15 NOTE — Telephone Encounter (Signed)
Called patient x 3 with no answer. Patient may reschedule for the next available appointment.  L.Wilson,LPN

## 2022-09-15 NOTE — Telephone Encounter (Signed)
Noted  

## 2022-09-16 NOTE — Telephone Encounter (Signed)
Left message for patient to call back and schedule Medicare Annual Wellness Visit (AWV).   Please offer to do virtually or by telephone.  Left office number and my jabber #[phone].  AWVI eligible as of  11/22/2009  Please schedule at anytime with Nurse Health Advisor.   

## 2022-09-17 ENCOUNTER — Encounter: Payer: Self-pay | Admitting: Nurse Practitioner

## 2022-09-22 ENCOUNTER — Encounter: Payer: Self-pay | Admitting: Nurse Practitioner

## 2022-09-22 MED ORDER — ALBUTEROL SULFATE HFA 108 (90 BASE) MCG/ACT IN AERS: 2.0000 | INHALATION_SPRAY | Freq: Four times a day (QID) | RESPIRATORY_TRACT | 0 refills | Status: DC | PRN

## 2022-09-24 ENCOUNTER — Ambulatory Visit: Payer: Medicare Other | Admitting: Nurse Practitioner

## 2022-09-24 ENCOUNTER — Telehealth: Payer: Self-pay | Admitting: Nurse Practitioner

## 2022-09-24 NOTE — Telephone Encounter (Signed)
Noted  

## 2022-09-24 NOTE — Telephone Encounter (Signed)
Pt was a no show for an Ov with Lauren on 09/24/22, I sent a no show letter. She called in stating     doesn't feel she needs this app, feeling a little better, rescheduled for 11/10.

## 2022-09-24 NOTE — Telephone Encounter (Signed)
1st no show, fee waived, letter sent - pt called stating she felt appt no longer needed but rescheduled for 11/10

## 2022-10-01 ENCOUNTER — Ambulatory Visit: Payer: Medicare Other | Admitting: Nurse Practitioner

## 2022-10-01 ENCOUNTER — Encounter: Payer: Self-pay | Admitting: Nurse Practitioner

## 2022-10-06 ENCOUNTER — Encounter: Payer: Self-pay | Admitting: Nurse Practitioner

## 2022-10-06 ENCOUNTER — Other Ambulatory Visit: Payer: Self-pay | Admitting: Nurse Practitioner

## 2022-10-06 MED ORDER — LORAZEPAM 0.5 MG PO TABS: ORAL_TABLET | ORAL | 0 refills | Status: DC

## 2022-10-06 NOTE — Telephone Encounter (Signed)
Needs appointment for future refills.

## 2022-10-06 NOTE — Telephone Encounter (Signed)
Refill request for  Lorazepam 0.5 mg LR  09/06/22, # 120, 0 rf LOV 05/27/22 FOV none scheduled.    Please review and advise.   Thanks. Dm/cma

## 2022-10-11 NOTE — Telephone Encounter (Signed)
Left message for patient to call back and schedule Medicare Annual Wellness Visit (AWV) in office.  ° °If not able to come in office, please offer to do virtually or by telephone.  Left office number and my jabber #[phone]. ° °AWVI eligible as of 11/22/2009 ° °Please schedule at anytime with Nurse Health Advisor. °  °

## 2022-11-06 NOTE — Progress Notes (Unsigned)
   Established Patient Office Visit  Subjective   Patient ID: Veronica Rangel, female    DOB: 1966/08/11  Age: 56 y.o. MRN: [id number]  No chief complaint on file.   HPI  Veronica Rangel is here to follow-up on anxiety and fibromyalgia.   {History (Optional):23778}  ROS    Objective:     There were no vitals taken for this visit. {Vitals History (Optional):23777}  Physical Exam   No results found for any visits on 11/08/22.  {Labs (Optional):23779}  The ASCVD Risk score (Arnett DK, et al., 2019) failed to calculate for the following reasons:   Cannot find a previous HDL lab   Cannot find a previous total cholesterol lab    Assessment & Plan:   Problem List Items Addressed This Visit   None   No follow-ups on file.    Charyl Dancer, NP

## 2022-11-08 ENCOUNTER — Ambulatory Visit (INDEPENDENT_AMBULATORY_CARE_PROVIDER_SITE_OTHER): Payer: Medicare Other | Admitting: Nurse Practitioner

## 2022-11-08 ENCOUNTER — Encounter: Payer: Self-pay | Admitting: Nurse Practitioner

## 2022-11-08 VITALS — BP 130/80 | HR 96 | Temp 98.6°F | Ht 64.0 in | Wt 222.4 lb

## 2022-11-08 DIAGNOSIS — M069 Rheumatoid arthritis, unspecified: Secondary | ICD-10-CM

## 2022-11-08 DIAGNOSIS — Z13220 Encounter for screening for lipoid disorders: Secondary | ICD-10-CM

## 2022-11-08 DIAGNOSIS — R1084 Generalized abdominal pain: Secondary | ICD-10-CM

## 2022-11-08 DIAGNOSIS — M797 Fibromyalgia: Secondary | ICD-10-CM | POA: Diagnosis not present

## 2022-11-08 DIAGNOSIS — F321 Major depressive disorder, single episode, moderate: Secondary | ICD-10-CM

## 2022-11-08 DIAGNOSIS — R7301 Impaired fasting glucose: Secondary | ICD-10-CM

## 2022-11-08 DIAGNOSIS — F411 Generalized anxiety disorder: Secondary | ICD-10-CM

## 2022-11-08 DIAGNOSIS — Z1211 Encounter for screening for malignant neoplasm of colon: Secondary | ICD-10-CM

## 2022-11-08 DIAGNOSIS — J449 Chronic obstructive pulmonary disease, unspecified: Secondary | ICD-10-CM

## 2022-11-08 MED ORDER — ALBUTEROL SULFATE HFA 108 (90 BASE) MCG/ACT IN AERS: 2.0000 | INHALATION_SPRAY | Freq: Four times a day (QID) | RESPIRATORY_TRACT | 0 refills | Status: DC | PRN

## 2022-11-08 MED ORDER — NORTRIPTYLINE HCL 10 MG PO CAPS: 10.0000 mg | ORAL_CAPSULE | Freq: Every day | ORAL | 2 refills | Status: DC

## 2022-11-08 MED ORDER — METHYLPREDNISOLONE 4 MG PO TBPK: ORAL_TABLET | ORAL | 0 refills | Status: DC

## 2022-11-08 MED ORDER — LORAZEPAM 0.5 MG PO TABS: ORAL_TABLET | ORAL | 0 refills | Status: DC

## 2022-11-08 NOTE — Assessment & Plan Note (Signed)
Chronic, stable.  She states that her depression is doing well at this point, however her anxiety has worsened.  Her PHQ-9 is a 13 and her GAD-7 is a 13 today.  Will add on nortiptyline 10 mg at bedtime.  Discussed possible side effects.  She denies SI/HI.  Follow-up in 3 months.

## 2022-11-08 NOTE — Assessment & Plan Note (Signed)
Chronic, not controlled.  She is still having some anxiety.  Will have her continue venlafaxine 225 mg daily and Ativan 0.5 mg with breakfast and lunch, and 1 mg at bedtime.  Will also add on nortriptyline 10 mg at bedtime.  PDMP reviewed.  Follow-up in 3 months, sooner with concerns.

## 2022-11-08 NOTE — Patient Instructions (Signed)
It was great to see you!  I have sent in methylprednisolone to your pharmacy. I am putting in a referral to rheumatology.   We are checking your labs today and will let you know the results via mychart/phone.   Start nortiptyline 1 tablet at bedtime. This can help with anxiety and the burning/cramping stomach pain.   I have ordered a CT scan at the following location, they will call to schedule:  Address: [address] [place], [place] [address] Phone: [phone]  Let's follow-up in 3 months, sooner if you have concerns.  If a referral was placed today, you will be contacted for an appointment. Please note that routine referrals can sometimes take up to 3-4 weeks to process. Please call our office if you haven't heard anything after this time frame.  Take care,  Vance Peper, NP

## 2022-11-08 NOTE — Assessment & Plan Note (Signed)
She has been having generalized abdominal pain for the last year.  She states that it gets better with heat and laying down.  She describes it as a cramping/burning sensation.  She has already had her gallbladder and appendix removed.  With ongoing pain, will check a CT of the abdomen and pelvis.  Will start nortriptyline 10 mg at bedtime to help with anxiety and see if this helps with the pain.  Discussed possible side effects.  Follow-up in 3 months.

## 2022-11-08 NOTE — Assessment & Plan Note (Signed)
Chronic, stable.  Continue albuterol inhaler as needed for shortness of breath or wheezing.  Refill sent to the pharmacy.

## 2022-11-08 NOTE — Assessment & Plan Note (Signed)
Chronic, ongoing.  She is having increased pain in her right wrist today.  Will send in some methylprednisolone 4 mg to take daily as needed for pain.  She states that she only takes this for couple days as it causes an increase in her anxiety and depression.  She is also interested in a referral back to rheumatology.  Will check ANA and rheumatoid factor as we do not have lab work available.

## 2022-11-08 NOTE — Assessment & Plan Note (Signed)
Chronic, ongoing.  She is taking venlafaxine which has helped some with the pain.  Unsure if abdominal pain is related to fibromyalgia versus another cause.  Checking CT as noted above.  Start nortriptyline 10 mg at bedtime to see if it helps with pain, anxiety, depression.

## 2022-11-09 ENCOUNTER — Encounter: Payer: Self-pay | Admitting: Nurse Practitioner

## 2022-11-09 LAB — COMPREHENSIVE METABOLIC PANEL
ALT: 18 U/L (ref 0–35)
AST: 15 U/L (ref 0–37)
Albumin: 4 g/dL (ref 3.5–5.2)
Alkaline Phosphatase: 96 U/L (ref 39–117)
BUN: 10 mg/dL (ref 6–23)
CO2: 23 mEq/L (ref 19–32)
Calcium: 9.1 mg/dL (ref 8.4–10.5)
Chloride: 102 mEq/L (ref 96–112)
Creatinine, Ser: 0.65 mg/dL (ref 0.40–1.20)
GFR: 96.83 mL/min (ref >60.00)
Glucose, Bld: 98 mg/dL (ref 70–99)
Potassium: 4.6 mEq/L (ref 3.5–5.1)
Sodium: 136 mEq/L (ref 135–145)
Total Bilirubin: 0.2 mg/dL (ref 0.2–1.2)
Total Protein: 7 g/dL (ref 6.0–8.3)

## 2022-11-09 LAB — HEMOGLOBIN A1C: Hgb A1c MFr Bld: 6.8 % — ABNORMAL HIGH (ref 4.6–6.5)

## 2022-11-09 LAB — LIPID PANEL
Cholesterol: 223 mg/dL — ABNORMAL HIGH (ref 0–200)
HDL: 48.6 mg/dL (ref >39.00)
NonHDL: 174.45
Total CHOL/HDL Ratio: 5
Triglycerides: 239 mg/dL — ABNORMAL HIGH (ref 0.0–149.0)
VLDL: 47.8 mg/dL — ABNORMAL HIGH (ref 0.0–40.0)

## 2022-11-09 LAB — ANA W/REFLEX: Anti Nuclear Antibody (ANA): NEGATIVE

## 2022-11-09 LAB — RHEUMATOID FACTOR: Rheumatoid fact SerPl-aCnc: 415 IU/mL — ABNORMAL HIGH (ref <14)

## 2022-11-09 LAB — LDL CHOLESTEROL, DIRECT: Direct LDL: 144 mg/dL

## 2022-11-09 NOTE — Addendum Note (Signed)
Addended by: Vance Peper A on: 11/09/2022 08:08 AM   Modules accepted: Orders

## 2022-11-10 ENCOUNTER — Encounter: Payer: Self-pay | Admitting: Nurse Practitioner

## 2022-11-12 ENCOUNTER — Other Ambulatory Visit: Payer: Self-pay | Admitting: Nurse Practitioner

## 2022-11-19 ENCOUNTER — Ambulatory Visit
Admission: RE | Admit: 2022-11-19 | Discharge: 2022-11-19 | Disposition: A | Discharge status: Home / Self Care | Payer: Medicare Other | Source: Ambulatory Visit | Attending: Nurse Practitioner | Admitting: Nurse Practitioner

## 2022-11-19 DIAGNOSIS — R1084 Generalized abdominal pain: Secondary | ICD-10-CM | POA: Diagnosis present

## 2022-11-24 ENCOUNTER — Encounter: Payer: Self-pay | Admitting: Nurse Practitioner

## 2022-11-24 ENCOUNTER — Telehealth: Payer: Self-pay | Admitting: Nurse Practitioner

## 2022-11-24 NOTE — Telephone Encounter (Signed)
Left message for patient to call back and schedule Medicare Annual Wellness Visit (AWV) in office.   If not able to come in office, please offer to do virtually or by telephone.  Left office number and my jabber [phone].  Last AWV:10/13/2021  Please schedule at anytime with Nurse Health Advisor.

## 2022-12-02 ENCOUNTER — Telehealth: Payer: Self-pay | Admitting: Nurse Practitioner

## 2022-12-02 NOTE — Telephone Encounter (Signed)
Left message for patient to call back and schedule Medicare Annual Wellness Visit (AWV) in office.  ° °If not able to come in office, please offer to do virtually or by telephone.  Left office number and my jabber #[phone]. ° °AWVI eligible as of 11/22/2009 ° °Please schedule at anytime with Nurse Health Advisor. °  °

## 2022-12-09 ENCOUNTER — Other Ambulatory Visit: Payer: Self-pay | Admitting: Nurse Practitioner

## 2022-12-09 ENCOUNTER — Encounter: Payer: Self-pay | Admitting: Nurse Practitioner

## 2022-12-09 MED ORDER — LORAZEPAM 0.5 MG PO TABS: ORAL_TABLET | ORAL | 0 refills | Status: DC

## 2022-12-27 ENCOUNTER — Telehealth: Payer: Self-pay | Admitting: Nurse Practitioner

## 2022-12-27 NOTE — Telephone Encounter (Signed)
Omiga with [facility] called to advise that the patient has no showed twice for the appointments that have been made so they will not be scheduling another appointment.

## 2022-12-27 NOTE — Telephone Encounter (Signed)
Referral moved to [facility]

## 2022-12-30 ENCOUNTER — Other Ambulatory Visit: Payer: Self-pay | Admitting: Nurse Practitioner

## 2022-12-31 ENCOUNTER — Encounter: Payer: Self-pay | Admitting: Nurse Practitioner

## 2023-01-03 MED ORDER — METHOCARBAMOL 500 MG PO TABS: 500.0000 mg | ORAL_TABLET | Freq: Three times a day (TID) | ORAL | 1 refills | Status: DC | PRN

## 2023-01-07 ENCOUNTER — Other Ambulatory Visit: Payer: Self-pay | Admitting: Nurse Practitioner

## 2023-01-10 ENCOUNTER — Encounter: Payer: Self-pay | Admitting: Nurse Practitioner

## 2023-01-10 MED ORDER — LORAZEPAM 0.5 MG PO TABS: ORAL_TABLET | ORAL | 0 refills | Status: DC

## 2023-01-13 ENCOUNTER — Telehealth: Payer: Self-pay | Admitting: Nurse Practitioner

## 2023-01-13 NOTE — Telephone Encounter (Signed)
Called patient to schedule Medicare Annual Wellness Visit (AWV). Left message for patient to call back and schedule Medicare Annual Wellness Visit (AWV).  Last date of AWV:  DUE 11/22/2009 AWVI PER [place]   Please schedule an appointment at any time with [facility]  [place].  If any questions, please contact me at [phone].  Thank you ,  Ceylin Stieglitz AWV direct phone # [phone]

## 2023-01-19 ENCOUNTER — Encounter: Payer: Self-pay | Admitting: Nurse Practitioner

## 2023-01-20 ENCOUNTER — Other Ambulatory Visit: Payer: Self-pay | Admitting: Nurse Practitioner

## 2023-01-20 ENCOUNTER — Encounter: Payer: Self-pay | Admitting: Nurse Practitioner

## 2023-01-20 MED ORDER — VENLAFAXINE HCL ER 150 MG PO CP24: 150.0000 mg | ORAL_CAPSULE | Freq: Every day | ORAL | 1 refills | Status: DC

## 2023-01-20 MED ORDER — VENLAFAXINE HCL ER 75 MG PO CP24: 75.0000 mg | ORAL_CAPSULE | Freq: Every day | ORAL | 1 refills | Status: DC

## 2023-02-04 ENCOUNTER — Encounter: Payer: Self-pay | Admitting: Nurse Practitioner

## 2023-02-04 MED ORDER — ALBUTEROL SULFATE HFA 108 (90 BASE) MCG/ACT IN AERS: 2.0000 | INHALATION_SPRAY | Freq: Four times a day (QID) | RESPIRATORY_TRACT | 3 refills | Status: DC | PRN

## 2023-02-07 ENCOUNTER — Encounter: Payer: Self-pay | Admitting: Nurse Practitioner

## 2023-02-07 ENCOUNTER — Ambulatory Visit: Payer: Medicare Other | Admitting: Nurse Practitioner

## 2023-02-07 ENCOUNTER — Telehealth: Payer: Self-pay | Admitting: Nurse Practitioner

## 2023-02-07 NOTE — Telephone Encounter (Signed)
Pt changed her app from today 02/07/23 to 02/16/23. She is feeling sick and does not want to come into the office.

## 2023-02-08 NOTE — Telephone Encounter (Signed)
Noted  

## 2023-02-08 NOTE — Telephone Encounter (Signed)
same day cancellation/not feeling well, did not count as no show

## 2023-02-10 ENCOUNTER — Other Ambulatory Visit: Payer: Self-pay | Admitting: Nurse Practitioner

## 2023-02-10 MED ORDER — LORAZEPAM 0.5 MG PO TABS: ORAL_TABLET | ORAL | 0 refills | Status: DC

## 2023-02-10 NOTE — Telephone Encounter (Signed)
Refill request for  Lorazepam 0.5 mg LR  01/10/23, # 120, 0 rf LOV  11/08/22 FOV  01/22/23  Please review and advise.  Thanks. Dm/cma

## 2023-02-16 ENCOUNTER — Telehealth: Payer: Self-pay | Admitting: Nurse Practitioner

## 2023-02-16 ENCOUNTER — Ambulatory Visit: Payer: Medicare Other | Admitting: Nurse Practitioner

## 2023-02-16 NOTE — Telephone Encounter (Signed)
Pt is a no show for an OV with Lauren on 02/16/23, I sent a no show letter.

## 2023-02-24 ENCOUNTER — Encounter: Payer: Self-pay | Admitting: Nurse Practitioner

## 2023-02-24 NOTE — Telephone Encounter (Signed)
Noted  

## 2023-02-24 NOTE — Telephone Encounter (Signed)
09/24/2022 no show, 02/07/2023 same day cancel, and 02/16/2023 no show  Final warning sent via mail and mychart, fee generated

## 2023-03-02 ENCOUNTER — Telehealth: Payer: Self-pay | Admitting: Nurse Practitioner

## 2023-03-02 NOTE — Telephone Encounter (Signed)
Contacted Chanta Scalera to schedule their annual wellness visit. Appointment made for 03/04/23.  Rudell Cobb AWV direct phone # [phone])*

## 2023-03-04 ENCOUNTER — Telehealth: Payer: Self-pay

## 2023-03-04 ENCOUNTER — Ambulatory Visit: Payer: Medicare Other

## 2023-03-04 NOTE — Telephone Encounter (Signed)
This nurse attempted to call patient three times for AWV. Message left that we will call again to schedule for another time. 

## 2023-03-10 ENCOUNTER — Ambulatory Visit (INDEPENDENT_AMBULATORY_CARE_PROVIDER_SITE_OTHER): Payer: Medicare Other | Admitting: Nurse Practitioner

## 2023-03-10 ENCOUNTER — Encounter: Payer: Self-pay | Admitting: Nurse Practitioner

## 2023-03-10 VITALS — BP 130/82 | HR 101 | Temp 97.4°F | Ht 64.0 in | Wt 224.4 lb

## 2023-03-10 DIAGNOSIS — K219 Gastro-esophageal reflux disease without esophagitis: Secondary | ICD-10-CM

## 2023-03-10 DIAGNOSIS — R1084 Generalized abdominal pain: Secondary | ICD-10-CM

## 2023-03-10 DIAGNOSIS — F321 Major depressive disorder, single episode, moderate: Secondary | ICD-10-CM | POA: Diagnosis not present

## 2023-03-10 DIAGNOSIS — R7303 Prediabetes: Secondary | ICD-10-CM | POA: Diagnosis not present

## 2023-03-10 DIAGNOSIS — J449 Chronic obstructive pulmonary disease, unspecified: Secondary | ICD-10-CM

## 2023-03-10 DIAGNOSIS — M797 Fibromyalgia: Secondary | ICD-10-CM | POA: Diagnosis not present

## 2023-03-10 DIAGNOSIS — F411 Generalized anxiety disorder: Secondary | ICD-10-CM

## 2023-03-10 DIAGNOSIS — Z720 Tobacco use: Secondary | ICD-10-CM

## 2023-03-10 LAB — POCT GLYCOSYLATED HEMOGLOBIN (HGB A1C)
HbA1c POC (<> result, manual entry): 6 % (ref 4.0–5.6)
HbA1c, POC (controlled diabetic range): 6 % (ref 0.0–7.0)
HbA1c, POC (prediabetic range): 6 % (ref 5.7–6.4)
Hemoglobin A1C: 6 % — AB (ref 4.0–5.6)

## 2023-03-10 MED ORDER — ESOMEPRAZOLE MAGNESIUM 40 MG PO CPDR: 40.0000 mg | DELAYED_RELEASE_CAPSULE | Freq: Every day | ORAL | 3 refills | Status: DC

## 2023-03-10 NOTE — Progress Notes (Signed)
Established Patient Office Visit  Subjective   Patient ID: Veronica Rangel, female    DOB: 1966/05/02  Age: 57 y.o. MRN: [id number]  Chief Complaint  Patient presents with   Abdominal Pain    Ongoing and pain under breast    HPI  Veronica Rangel is here to follow-up on abdominal pain, depression, and anxiety.   She states that she is still having abdominal pain that starts on the right side and goes up to her right breast.  She states that somebody is pressing on her.  She had a CT scan which was normal of her abdomen and pelvis.  She declines mammogram.  She has been using IcyHot patches which help along with taking Goody powders.  Encouraged her to stop the [place] powders as this can make her acid reflux worse.  She is saying that her acid reflux is not controlled either, she is having to take Prilosec twice a day.  She states the Protonix did not help, it caused her to have a headache.  She denies nausea and vomiting.  She does have issues with chronic constipation that she takes MiraLAX for. She states that she took the Robaxin once and it did not help.  She denies chest pain and shortness of breath.  She states that she is still having ongoing anxiety and OCD.  She denies any panic attacks.  She has been taking venlafaxine to 25 mg daily and her Ativan 0.5 mg twice a day and 1 mg at bedtime.  She states that she has not seen the psychiatrist that she was referred to.  She also did not start the nortriptyline.  She has not been out of her house since her last visit in December.    ROS See pertinent positives and negatives per HPI.    Objective:     BP 130/82 (BP Location: Left Arm, Cuff Size: Large)   Pulse (!) 101   Temp (!) 97.4 F (36.3 C)   Ht  (1.626 m)   Wt 224 lb 6.4 oz (101.8 kg)   SpO2 98%   BMI 38.52 kg/m  BP Readings from Last 3 Encounters:  03/10/23 130/82  11/08/22 130/80  05/27/22 130/82   Wt Readings from Last 3 Encounters:  03/10/23 224 lb 6.4 oz (101.8  kg)  11/08/22 222 lb 6 oz (100.9 kg)  05/07/22 186 lb 3.2 oz (84.5 kg)    Physical Exam Vitals and nursing note reviewed.  Constitutional:      General: She is not in acute distress.    Appearance: Normal appearance.  HENT:     Head: Normocephalic.  Eyes:     Conjunctiva/sclera: Conjunctivae normal.  Cardiovascular:     Rate and Rhythm: Normal rate and regular rhythm.     Pulses: Normal pulses.     Heart sounds: Normal heart sounds.  Pulmonary:     Effort: Pulmonary effort is normal.     Breath sounds: Normal breath sounds.  Abdominal:     General: There is no distension.     Palpations: Abdomen is soft.     Tenderness: There is abdominal tenderness (RUQ). There is no guarding or rebound.  Musculoskeletal:     Cervical back: Normal range of motion.  Skin:    General: Skin is warm.  Neurological:     General: No focal deficit present.     Mental Status: She is alert and oriented to person, place, and time.  Psychiatric:  Mood and Affect: Mood normal.        Behavior: Behavior normal.        Thought Content: Thought content normal.        Judgment: Judgment normal.      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  Continue albuterol inhaler as needed for shortness of breath or wheezing.  Refill sent to the pharmacy.        Digestive   Gastroesophageal reflux disease    Chronic, not controlled.  She has been taking omeprazole twice a day which is still not helping  with her symptoms.  She took pantoprazole in the past, however that caused headaches.  Will have her start Nexium 40 mg daily.      Relevant Medications   esomeprazole (NEXIUM) 40 MG capsule     Other   Anxiety disorder - Primary    Chronic, not controlled.  She is still having some anxiety.  Will have her continue venlafaxine 225 mg daily and Ativan 0.5 mg with breakfast and lunch, and 1 mg at bedtime.  Encouraged her to start the nortriptyline 10 mg at bedtime that was prescribed last visit.  PDMP reviewed.  Follow-up in 3 months, sooner with concerns.  Referral also placed to psychiatry.      Relevant Orders   Ambulatory referral to Psychiatry   Depression, major, single episode, moderate    Chronic, stable.  She states that her depression is doing well at this point, however her anxiety has worsened. She denies SI/HI.  Follow-up in 3 months.      Relevant Orders   Ambulatory referral to Psychiatry   Fibromyalgia    Chronic, ongoing.  She is taking venlafaxine to 25 mg daily which is helping with some of the pain.  Abdominal pain may be related to fibromyalgia.  She did not start the nortriptyline, encouraged her to start this 10 mg at bedtime.  Referral placed to pain management.      Relevant Orders   Ambulatory referral to Pain Clinic   Tobacco use    Encourage complete tobacco cessation.      Generalized abdominal pain    Chronic, ongoing for the last year.  She states that it gets better with heat and laying down.  She had a CT of her abdomen which was negative.  She did not start the nortriptyline, encouraged her to start this 10 mg at bedtime.  This could be related to her fibromyalgia versus her anxiety.  Referral placed to pain management.  Follow-up in 3 months.      Relevant Orders   Ambulatory referral to Pain Clinic   Prediabetes    A1c is 6.0%.  Continue limiting sugars and focusing on healthy diet      Relevant Orders   POCT glycosylated hemoglobin (Hb A1C)  (Completed)  Return in about 3 months (around 06/09/2023) for Anxiety fibromyalgia.    Gerre Scull, NP

## 2023-03-10 NOTE — Assessment & Plan Note (Signed)
Chronic, not controlled.  She has been taking omeprazole twice a day which is still not helping with her symptoms.  She took pantoprazole in the past, however that caused headaches.  Will have her start Nexium 40 mg daily.

## 2023-03-10 NOTE — Assessment & Plan Note (Signed)
Chronic, not controlled.  She is still having some anxiety.  Will have her continue venlafaxine 225 mg daily and Ativan 0.5 mg with breakfast and lunch, and 1 mg at bedtime.  Encouraged her to start the nortriptyline 10 mg at bedtime that was prescribed last visit.  PDMP reviewed.  Follow-up in 3 months, sooner with concerns.  Referral also placed to psychiatry.

## 2023-03-10 NOTE — Assessment & Plan Note (Signed)
Chronic, stable.  Continue albuterol inhaler as needed for shortness of breath or wheezing.  Refill sent to the pharmacy. 

## 2023-03-10 NOTE — Assessment & Plan Note (Signed)
A1c is 6.0%.  Continue limiting sugars and focusing on healthy diet

## 2023-03-10 NOTE — Assessment & Plan Note (Signed)
Chronic, stable.  She states that her depression is doing well at this point, however her anxiety has worsened. She denies SI/HI.  Follow-up in 3 months.

## 2023-03-10 NOTE — Assessment & Plan Note (Signed)
Chronic, ongoing.  She is taking venlafaxine to 25 mg daily which is helping with some of the pain.  Abdominal pain may be related to fibromyalgia.  She did not start the nortriptyline, encouraged her to start this 10 mg at bedtime.  Referral placed to pain management.

## 2023-03-10 NOTE — Patient Instructions (Signed)
It was great to see you!  You were referred to Dr. Dierdre Forth for rheumatology:  [facility] [address] [place], [place] [address] [phone]  Switch from prilosec to nexium once a day to see if this will help with your reflux.   I have placed a referral to psychiatry and pain management   Let's follow-up in 3 months, sooner if you have concerns.  If a referral was placed today, you will be contacted for an appointment. Please note that routine referrals can sometimes take up to 3-4 weeks to process. Please call our office if you haven't heard anything after this time frame.  Take care,  Rodman Pickle, NP

## 2023-03-10 NOTE — Assessment & Plan Note (Signed)
Encourage complete tobacco cessation. 

## 2023-03-10 NOTE — Assessment & Plan Note (Signed)
Chronic, ongoing for the last year.  She states that it gets better with heat and laying down.  She had a CT of her abdomen which was negative.  She did not start the nortriptyline, encouraged her to start this 10 mg at bedtime.  This could be related to her fibromyalgia versus her anxiety.  Referral placed to pain management.  Follow-up in 3 months.

## 2023-03-15 ENCOUNTER — Telehealth: Payer: Self-pay | Admitting: Nurse Practitioner

## 2023-03-15 NOTE — Telephone Encounter (Signed)
Called patient to schedule Medicare Annual Wellness Visit (AWV). Left message for patient to call back and schedule Medicare Annual Wellness Visit (AWV).  Last date of AWV:  DUE 11/22/2009 AWVI PER [facility]   Please schedule an appointment at any time with [facility].  If any questions, please contact me at [phone].  Thank you ,  [facility] AWV direct phone # [phone] 

## 2023-03-16 ENCOUNTER — Other Ambulatory Visit: Payer: Self-pay | Admitting: Nurse Practitioner

## 2023-03-16 MED ORDER — LORAZEPAM 0.5 MG PO TABS: ORAL_TABLET | ORAL | 0 refills | Status: DC

## 2023-03-17 ENCOUNTER — Encounter: Payer: Self-pay | Admitting: Nurse Practitioner

## 2023-04-05 ENCOUNTER — Other Ambulatory Visit: Payer: Self-pay | Admitting: Nurse Practitioner

## 2023-04-05 MED ORDER — PROPRANOLOL HCL 10 MG PO TABS: 10.0000 mg | ORAL_TABLET | Freq: Three times a day (TID) | ORAL | 0 refills | Status: DC

## 2023-04-05 NOTE — Telephone Encounter (Signed)
Requesting: propranolol (INDERAL) 10 MG tablet  Last Visit: 03/10/2023 Next Visit: 06/09/2023 Last Refill: 12/30/2022  Please Advise

## 2023-04-15 ENCOUNTER — Other Ambulatory Visit: Payer: Self-pay | Admitting: Nurse Practitioner

## 2023-04-18 ENCOUNTER — Encounter: Payer: Self-pay | Admitting: Nurse Practitioner

## 2023-04-18 ENCOUNTER — Other Ambulatory Visit: Payer: Self-pay | Admitting: Nurse Practitioner

## 2023-04-19 MED ORDER — LORAZEPAM 0.5 MG PO TABS: ORAL_TABLET | ORAL | 0 refills | Status: DC

## 2023-04-19 NOTE — Telephone Encounter (Signed)
LVM that Rx approved and sent in to pharmacy.

## 2023-05-20 ENCOUNTER — Other Ambulatory Visit: Payer: Self-pay | Admitting: Nurse Practitioner

## 2023-05-23 MED ORDER — LORAZEPAM 0.5 MG PO TABS: ORAL_TABLET | ORAL | 0 refills | Status: DC

## 2023-05-23 NOTE — Telephone Encounter (Signed)
Refill request for  Lorazepam 0.5 mg LR 04/19/23, #120, 0 rf LOV  03/10/23 FOV  06/09/23  Please review and advise.  Dm/cma

## 2023-06-09 ENCOUNTER — Telehealth: Payer: Self-pay | Admitting: Nurse Practitioner

## 2023-06-09 ENCOUNTER — Ambulatory Visit: Payer: Medicare Other | Admitting: Nurse Practitioner

## 2023-06-09 NOTE — Telephone Encounter (Signed)
7.18.24 no show letter was not sent. Pt has medicare

## 2023-06-13 ENCOUNTER — Encounter: Payer: Self-pay | Admitting: Nurse Practitioner

## 2023-06-13 NOTE — Telephone Encounter (Signed)
Noted  

## 2023-06-13 NOTE — Telephone Encounter (Signed)
3rd no show, dismissal generated per provider

## 2023-06-24 ENCOUNTER — Telehealth: Payer: Self-pay | Admitting: Nurse Practitioner

## 2023-06-24 NOTE — Telephone Encounter (Signed)
Confirmed with [facility] pt not requesting call back. Msg sent for FYI.

## 2023-06-24 NOTE — Telephone Encounter (Signed)
Pt called today wanting to make appointment but I did not because the pt was dismissed from our practice on 7.22.24 and I explained to the patient and she was upset

## 2023-06-27 ENCOUNTER — Telehealth: Payer: Self-pay

## 2023-06-27 MED ORDER — LORAZEPAM 0.5 MG PO TABS: ORAL_TABLET | ORAL | 0 refills | Status: DC

## 2023-06-27 NOTE — Telephone Encounter (Signed)
I called and notified patient that Rx was sent to her pharmacy

## 2023-06-27 NOTE — Telephone Encounter (Signed)
Pt was dismissed on 06/13/23 from [facility]. She is needing a refill on her LORazepam (ATIVAN) 0.5 MG tablet [[id number]] to bridge her until a new provider.   [facility] [id number] [place], [place] - [address] [place], [place] [address] Phone: [phone]  Fax: [phone]   Pt at [phone]

## 2023-07-08 ENCOUNTER — Ambulatory Visit: Payer: Medicare Other | Admitting: Primary Care

## 2023-07-18 ENCOUNTER — Ambulatory Visit (HOSPITAL_BASED_OUTPATIENT_CLINIC_OR_DEPARTMENT_OTHER): Payer: Medicare Other | Admitting: Family Medicine

## 2023-07-23 ENCOUNTER — Other Ambulatory Visit: Payer: Self-pay | Admitting: Nurse Practitioner

## 2023-07-25 ENCOUNTER — Encounter: Payer: Self-pay | Admitting: Emergency Medicine

## 2023-07-25 ENCOUNTER — Emergency Department
Admission: EM | Admit: 2023-07-25 | Discharge: 2023-07-25 | Disposition: A | Discharge status: Home / Self Care | Payer: Medicare Other | Attending: Emergency Medicine | Admitting: Emergency Medicine

## 2023-07-25 DIAGNOSIS — R0602 Shortness of breath: Secondary | ICD-10-CM | POA: Diagnosis present

## 2023-07-25 DIAGNOSIS — I10 Essential (primary) hypertension: Secondary | ICD-10-CM | POA: Diagnosis not present

## 2023-07-25 DIAGNOSIS — J449 Chronic obstructive pulmonary disease, unspecified: Secondary | ICD-10-CM | POA: Insufficient documentation

## 2023-07-25 DIAGNOSIS — F419 Anxiety disorder, unspecified: Secondary | ICD-10-CM | POA: Diagnosis not present

## 2023-07-25 MED ORDER — LORAZEPAM 0.5 MG PO TABS: 0.5000 mg | ORAL_TABLET | Freq: Four times a day (QID) | ORAL | 0 refills | Status: AC | PRN

## 2023-07-25 MED ORDER — IPRATROPIUM-ALBUTEROL 0.5-2.5 (3) MG/3ML IN SOLN
3.0000 mL | Freq: Once | RESPIRATORY_TRACT | Status: AC
  Administered 2023-07-25: 3 mL via RESPIRATORY_TRACT
  Filled 2023-07-25: qty 3

## 2023-07-25 MED ORDER — LORAZEPAM 1 MG PO TABS
1.0000 mg | ORAL_TABLET | Freq: Once | ORAL | Status: AC
  Administered 2023-07-25: 1 mg via ORAL
  Filled 2023-07-25: qty 1

## 2023-07-25 MED ORDER — VENLAFAXINE HCL ER 75 MG PO CP24: 75.0000 mg | ORAL_CAPSULE | Freq: Every day | ORAL | 1 refills | Status: DC

## 2023-07-25 MED ORDER — VENLAFAXINE HCL ER 150 MG PO CP24: 150.0000 mg | ORAL_CAPSULE | Freq: Every day | ORAL | 1 refills | Status: DC

## 2023-07-25 NOTE — ED Provider Notes (Signed)
[facility] Provider Note    Event Date/Time   First MD Initiated Contact with Patient 07/25/23 1243     (approximate)   History   Shortness of Breath   HPI  Veronica Rangel is a 57 y.o. female   Past medical history of anxiety and COPD on antidepressant and Ativan, who presents with increasing anxiety ever since running out her Ativan 1 week ago.  In the process switching primary doctors.    No drug or alcohol.    Some shortness of breath related to anxiety and also she wonders if COPD exacerbation.  He reports no cough no fever, no URI symptoms no chest pain.        Physical Exam   Triage Vital Signs: ED Triage Vitals  Encounter Vitals Group     BP 07/25/23 1243 (!) 152/78     Systolic BP Percentile --      Diastolic BP Percentile --      Pulse Rate 07/25/23 1243 87     Resp 07/25/23 1243 20     Temp 07/25/23 1243 97.8 F (36.6 C)     Temp Source 07/25/23 1243 Oral     SpO2 07/25/23 1243 96 %     Weight 07/25/23 1242 224 lb 13.9 oz (102 kg)     Height 07/25/23 1242 5\' 4"  (1.626 m)     Head Circumference --      Peak Flow --      Pain Score 07/25/23 1241 8     Pain Loc --      Pain Education --      Exclude from Growth Chart --     Most recent vital signs: Vitals:   07/25/23 1243 07/25/23 1330  BP: (!) 152/78 (!) 145/101  Pulse: 87 73  Resp: 20   Temp: 97.8 F (36.6 C)   SpO2: 96% 98%    General: Awake, no distress.  CV:  Good peripheral perfusion.  Resp:  Normal effort.  Abd:  No distention.  Other:  Awake alert anxious appearing.  Hemodynamics significant for hypertension otherwise normal.  Oxygen saturation normal on room air.  No respiratory distress and clear lungs without focality or wheezing.  No tachycardia autonomic instability tongue fasciculation or tremors to suggest acute benzodiazepine withdrawal   ED Results / Procedures / Treatments   Labs (all labs ordered are listed, but only abnormal results are  displayed) Labs Reviewed - No data to display  PROCEDURES:  Critical Care performed: No  Procedures   MEDICATIONS ORDERED IN ED: Medications  ipratropium-albuterol (DUONEB) 0.5-2.5 (3) MG/3ML nebulizer solution 3 mL (3 mLs Nebulization Given 07/25/23 1310)  LORazepam (ATIVAN) tablet 1 mg (1 mg Oral Given 07/25/23 1310)    IMPRESSION / MDM / ASSESSMENT AND PLAN / ED COURSE  I reviewed the triage vital signs and the nursing notes.                                Patient's presentation is most consistent with acute presentation with potential threat to life or bodily function.  Differential diagnosis includes, but is not limited to, anxiety, benzodiazepine withdrawal, COPD exacerbation or respiratory infection   The patient is on the cardiac monitor to evaluate for evidence of arrhythmia and/or significant heart rate changes.  MDM:    I think her symptoms are most likely due to anxiety in the setting of not having her benzodiazepines for  1 week.  She does not look to be in acute withdrawal without any autonomic instability in 1 week out from medications.  Will give her a p.o. dose of Ativan and a short prescription for the same and referral to PMD since she has lost touch with her old PMD.  I do not think she is having a COPD exacerbation as the shortness of breath is related only to when she is feeling anxious and she has no focality or wheezing on lung exam however I will give her a DuoNeb that she wishes because she did not take her nebulizer treatment today.  I doubt cardiopulmonary emergency like heart attack, PE, infection.     FINAL CLINICAL IMPRESSION(S) / ED DIAGNOSES   Final diagnoses:  Anxiety  Shortness of breath     Rx / DC Orders   ED Discharge Orders          Ordered    LORazepam (ATIVAN) 0.5 MG tablet  Every 6 hours PRN        07/25/23 1324    venlafaxine XR (EFFEXOR XR) 75 MG 24 hr capsule  Daily with breakfast        07/25/23 1324    venlafaxine XR  (EFFEXOR-XR) 150 MG 24 hr capsule  Daily with breakfast        07/25/23 1324    Ambulatory Referral to Primary Care (Establish Care)        07/25/23 1324             Note:  This document was prepared using Dragon voice recognition software and may include unintentional dictation errors.    Pilar Jarvis, MD 07/25/23 [phone]

## 2023-07-25 NOTE — ED Triage Notes (Signed)
Pt endorses SOB for a few days with hx of COPD. Also in the middle of doctor change and out of ativan since Thursday am. Endorses dizziness and panic attacks since then. Also having chronic pain.

## 2023-07-25 NOTE — Discharge Instructions (Signed)
Take medications as prescribed.  I made a referral to a primary doctor who should reach out to you to schedule an appointment.  Thank you for choosing [place] for your health care today!  Please see your primary doctor this week for a follow up appointment.   If you have any new, worsening, or unexpected symptoms call your doctor right away or come back to the emergency department for reevaluation.  It was my pleasure to care for you today.   Daneil Dan Modesto Charon, MD

## 2023-08-14 ENCOUNTER — Emergency Department: Payer: Medicare Other

## 2023-08-14 ENCOUNTER — Other Ambulatory Visit: Payer: Self-pay

## 2023-08-14 ENCOUNTER — Emergency Department
Admission: EM | Admit: 2023-08-14 | Discharge: 2023-08-14 | Disposition: A | Discharge status: Home / Self Care | Payer: Medicare Other | Attending: Emergency Medicine | Admitting: Emergency Medicine

## 2023-08-14 DIAGNOSIS — F419 Anxiety disorder, unspecified: Secondary | ICD-10-CM | POA: Insufficient documentation

## 2023-08-14 DIAGNOSIS — J449 Chronic obstructive pulmonary disease, unspecified: Secondary | ICD-10-CM | POA: Diagnosis not present

## 2023-08-14 LAB — BASIC METABOLIC PANEL
Anion gap: 14 (ref 5–15)
BUN: 8 mg/dL (ref 6–20)
CO2: 21 mmol/L — ABNORMAL LOW (ref 22–32)
Calcium: 9.1 mg/dL (ref 8.9–10.3)
Chloride: 104 mmol/L (ref 98–111)
Creatinine, Ser: 0.71 mg/dL (ref 0.44–1.00)
GFR, Estimated: >60 mL/min (ref >60)
Glucose, Bld: 136 mg/dL — ABNORMAL HIGH (ref 70–99)
Potassium: 4.2 mmol/L (ref 3.5–5.1)
Sodium: 139 mmol/L (ref 135–145)

## 2023-08-14 LAB — CBC
HCT: 39.9 % (ref 36.0–46.0)
Hemoglobin: 13.9 g/dL (ref 12.0–15.0)
MCH: 29.4 pg (ref 26.0–34.0)
MCHC: 34.8 g/dL (ref 30.0–36.0)
MCV: 84.4 fL (ref 80.0–100.0)
Platelets: 334 10*3/uL (ref 150–400)
RBC: 4.73 MIL/uL (ref 3.87–5.11)
RDW: 13.8 % (ref 11.5–15.5)
WBC: 9.1 10*3/uL (ref 4.0–10.5)
nRBC: 0 % (ref 0.0–0.2)

## 2023-08-14 MED ORDER — LORAZEPAM 0.5 MG PO TABS: 0.5000 mg | ORAL_TABLET | Freq: Four times a day (QID) | ORAL | 0 refills | Status: AC | PRN

## 2023-08-14 MED ORDER — ALBUTEROL SULFATE (2.5 MG/3ML) 0.083% IN NEBU
2.5000 mg | INHALATION_SOLUTION | Freq: Once | RESPIRATORY_TRACT | Status: AC
  Administered 2023-08-14: 2.5 mg via RESPIRATORY_TRACT
  Filled 2023-08-14: qty 3

## 2023-08-14 MED ORDER — PROPRANOLOL HCL 20 MG PO TABS
10.0000 mg | ORAL_TABLET | Freq: Once | ORAL | Status: AC
  Administered 2023-08-14: 10 mg via ORAL
  Filled 2023-08-14: qty 1

## 2023-08-14 MED ORDER — PROPRANOLOL HCL 10 MG PO TABS: 10.0000 mg | ORAL_TABLET | Freq: Three times a day (TID) | ORAL | 0 refills | Status: DC

## 2023-08-14 MED ORDER — LORAZEPAM 0.5 MG PO TABS
0.5000 mg | ORAL_TABLET | Freq: Once | ORAL | Status: AC
  Administered 2023-08-14: 0.5 mg via ORAL
  Filled 2023-08-14: qty 1

## 2023-08-14 NOTE — ED Notes (Signed)
Pt verbalizes understanding of discharge instructions. Opportunity for questioning and answers were provided. Pt discharged from ED to home with husband.

## 2023-08-14 NOTE — Discharge Instructions (Addendum)
Take the Inderal and Ativan as prescribed.  Take all of your other normal medications as prescribed.  Make an appointment to follow-up with a primary care provider.  Return to the ER for new, worsening, or persistent severe anxiety, shortness of breath, chest pain, weakness, or any other new or worsening symptoms that concern you.

## 2023-08-14 NOTE — ED Triage Notes (Signed)
Pt come with c/o sob and hx of copd. Pt states some cp and out of some of her meds. Pt states belly pain that is squeezing.

## 2023-08-14 NOTE — ED Provider Notes (Signed)
[facility] Provider Note    Event Date/Time   First MD Initiated Contact with Patient 08/14/23 1516     (approximate)   History   Shortness of Breath   HPI  Veronica Rangel is a 57 y.o. female with a history of COPD, GERD, fibromyalgia, and anxiety who presents with shortness of breath and anxiety over the last several days.  The patient states that she has been out of her Ativan and Inderal since last week and has had increased shortness of breath, anxiety, and some intermittent chest discomfort.  She also reports epigastric abdominal pain but states that this is chronic and related to her fibromyalgia.  She denies any acute chest pain currently.  She has no fever or chills.  She denies any cough.  She has no vomiting or diarrhea.  Reviewed the past medical records.  The patient was seen in the ED on 9/2 for anxiety.  She was prescribed Ativan at that time.  Previously her most recent outpatient encounter was on 4/18 with primary care for follow-up of abdominal pain.  The patient states that since her ED visit she has been unable to find a new primary care provider which is the main reason she was unable to continue her medications.   Physical Exam   Triage Vital Signs: ED Triage Vitals  Encounter Vitals Group     BP 08/14/23 1411 (!) 145/91     Systolic BP Percentile --      Diastolic BP Percentile --      Pulse Rate 08/14/23 1411 (!) 114     Resp 08/14/23 1411 20     Temp 08/14/23 1411 98.2 F (36.8 C)     Temp src --      SpO2 08/14/23 1411 100 %     Weight --      Height --      Head Circumference --      Peak Flow --      Pain Score 08/14/23 1410 10     Pain Loc --      Pain Education --      Exclude from Growth Chart --     Most recent vital signs: Vitals:   08/14/23 1411 08/14/23 1737  BP: (!) 145/91 (!) 141/78  Pulse: (!) 114 91  Resp: 20 18  Temp: 98.2 F (36.8 C)   SpO2: 100% 98%     General: Alert, anxious appearing, no  distress.  CV:  Good peripheral perfusion.  Tachycardic, regular rhythm. Resp:  Normal effort.  Lungs CTAB. Abd:  No distention.  Other:  No peripheral edema.   ED Results / Procedures / Treatments   Labs (all labs ordered are listed, but only abnormal results are displayed) Labs Reviewed  BASIC METABOLIC PANEL - Abnormal; Notable for the following components:      Result Value   CO2 21 (*)    Glucose, Bld 136 (*)    All other components within normal limits  CBC     EKG  ED ECG REPORT I, Dionne Bucy, the attending physician, personally viewed and interpreted this ECG.  Date: 08/14/2023 EKG Time: 1409 Rate: 114 sinus Rhythm: Sinus tachycardia QRS Axis: normal Intervals: normal ST/T Wave abnormalities: normal Narrative Interpretation: no evidence of acute ischemia    RADIOLOGY  Chest x-ray: I independently viewed and interpreted the images; there is no focal consolidation or edema  PROCEDURES:  Critical Care performed: No  Procedures   MEDICATIONS ORDERED IN  ED: Medications  LORazepam (ATIVAN) tablet 0.5 mg (0.5 mg Oral Given 08/14/23 1543)  propranolol (INDERAL) tablet 10 mg (10 mg Oral Given 08/14/23 1544)  albuterol (PROVENTIL) (2.5 MG/3ML) 0.083% nebulizer solution 2.5 mg (2.5 mg Nebulization Given 08/14/23 1543)     IMPRESSION / MDM / ASSESSMENT AND PLAN / ED COURSE  I reviewed the triage vital signs and the nursing notes.  57 year old female with PMH as noted above presents with increased anxiety and shortness of breath over the last several days after running out of Ativan and propranolol.  She has had some intermittent chest discomfort but is not having any active chest pain.  Physical exam is unremarkable except for mild tachycardia.  Differential diagnosis includes, but is not limited to, acute anxiety, exacerbation of chronic anxiety, COPD exacerbation, acute bronchitis, pneumonia.  There is no evidence of cardiac etiology.  Given that she  has not having any active chest pain or hypoxia, or any DVT symptoms, there is no clinical evidence for PE.  The patient is not tremulous or hypertensive and was only on the Ativan for a few weeks; there is no evidence of benzodiazepine withdrawal.  Patient's presentation is most consistent with exacerbation of chronic illness.  The patient is on the cardiac monitor to evaluate for evidence of arrhythmia and/or significant heart rate changes.   Basic labs are unremarkable; there is no leukocytosis or anemia.  Electrolytes are normal.  Chest x-ray is clear.  EKG is nonischemic.  We will give the patient propranolol, Ativan, albuterol, and reassess.  ----------------------------------------- 5:38 PM on 08/14/2023 -----------------------------------------  The patient is feeling significantly better and appears comfortable.  The tachycardia has resolved.  Overall presentation is consistent with anxiety.  She is stable for discharge home at this time.  I counseled her on the results of the workup.  I reviewed her PDMP record.  The patient has been regularly prescribed Ativan until she could not continue with her PMD.  I have prescribed a 15-day supply of her usual dose and frequency as well as a 30-day supply of the Inderal.  The patient has plans to follow-up with her primary care provider in [place].  She is stable for discharge at this time.  Return precautions given, and she expresses understanding.   FINAL CLINICAL IMPRESSION(S) / ED DIAGNOSES   Final diagnoses:  Anxiety     Rx / DC Orders   ED Discharge Orders          Ordered    propranolol (INDERAL) 10 MG tablet  3 times daily        08/14/23 1731    LORazepam (ATIVAN) 0.5 MG tablet  Every 6 hours PRN        08/14/23 1731             Note:  This document was prepared using Dragon voice recognition software and may include unintentional dictation errors.    Dionne Bucy, MD 08/14/23 1739

## 2023-08-14 NOTE — ED Notes (Signed)
ED Provider at bedside. 

## 2023-09-09 ENCOUNTER — Other Ambulatory Visit: Payer: Self-pay

## 2023-09-09 ENCOUNTER — Emergency Department
Admission: EM | Admit: 2023-09-09 | Discharge: 2023-09-09 | Disposition: A | Discharge status: Home / Self Care | Payer: Medicare Other | Attending: Emergency Medicine | Admitting: Emergency Medicine

## 2023-09-09 DIAGNOSIS — R0602 Shortness of breath: Secondary | ICD-10-CM | POA: Diagnosis not present

## 2023-09-09 DIAGNOSIS — R1033 Periumbilical pain: Secondary | ICD-10-CM | POA: Insufficient documentation

## 2023-09-09 DIAGNOSIS — F419 Anxiety disorder, unspecified: Secondary | ICD-10-CM | POA: Insufficient documentation

## 2023-09-09 LAB — COMPREHENSIVE METABOLIC PANEL
ALT: 22 U/L (ref 0–44)
AST: 21 U/L (ref 15–41)
Albumin: 4 g/dL (ref 3.5–5.0)
Alkaline Phosphatase: 75 U/L (ref 38–126)
Anion gap: 10 (ref 5–15)
BUN: 11 mg/dL (ref 6–20)
CO2: 22 mmol/L (ref 22–32)
Calcium: 9.1 mg/dL (ref 8.9–10.3)
Chloride: 104 mmol/L (ref 98–111)
Creatinine, Ser: 0.79 mg/dL (ref 0.44–1.00)
GFR, Estimated: >60 mL/min (ref >60)
Glucose, Bld: 127 mg/dL — ABNORMAL HIGH (ref 70–99)
Potassium: 4.2 mmol/L (ref 3.5–5.1)
Sodium: 136 mmol/L (ref 135–145)
Total Bilirubin: 0.4 mg/dL (ref 0.3–1.2)
Total Protein: 8.2 g/dL — ABNORMAL HIGH (ref 6.5–8.1)

## 2023-09-09 LAB — CBC
HCT: 41.4 % (ref 36.0–46.0)
Hemoglobin: 14.2 g/dL (ref 12.0–15.0)
MCH: 29 pg (ref 26.0–34.0)
MCHC: 34.3 g/dL (ref 30.0–36.0)
MCV: 84.7 fL (ref 80.0–100.0)
Platelets: 324 10*3/uL (ref 150–400)
RBC: 4.89 MIL/uL (ref 3.87–5.11)
RDW: 13.7 % (ref 11.5–15.5)
WBC: 7.6 10*3/uL (ref 4.0–10.5)
nRBC: 0 % (ref 0.0–0.2)

## 2023-09-09 LAB — LIPASE, BLOOD: Lipase: 21 U/L (ref 11–51)

## 2023-09-09 MED ORDER — LORAZEPAM 0.5 MG PO TABS
0.5000 mg | ORAL_TABLET | Freq: Two times a day (BID) | ORAL | 0 refills | Status: DC
Start: 2023-09-09 — End: 2023-09-27

## 2023-09-09 MED ORDER — LORAZEPAM 1 MG PO TABS
0.5000 mg | ORAL_TABLET | Freq: Once | ORAL | Status: AC
  Administered 2023-09-09: 0.5 mg via ORAL
  Filled 2023-09-09: qty 1

## 2023-09-09 MED ORDER — ALBUTEROL SULFATE (2.5 MG/3ML) 0.083% IN NEBU
2.5000 mg | INHALATION_SOLUTION | Freq: Once | RESPIRATORY_TRACT | Status: AC
  Administered 2023-09-09: 2.5 mg via RESPIRATORY_TRACT
  Filled 2023-09-09: qty 3

## 2023-09-09 NOTE — ED Triage Notes (Signed)
Patient c/o sharp umbilical pain that burns. Also c/o heaviness in bilateral breasts and feeling like she cannot breath especially when she feels nervous.   Reports being told at her last trip to the doctor her blood sugar was high and might need to be started on medication

## 2023-09-09 NOTE — ED Notes (Signed)
Pt verbalizes understanding of discharge instructions. Opportunity for questioning and answers were provided. Pt discharged from ED to home with husband.    

## 2023-09-09 NOTE — ED Provider Notes (Signed)
[facility] Provider Note    Event Date/Time   First MD Initiated Contact with Patient 09/09/23 1624     (approximate)   History   Abdominal Pain   HPI  Veronica Rangel is a 57 y.o. female with PMH of fibromyalgia, RA, depression, anxiety presents for evaluation of abdominal pain and shortness of breath.  Patient states she has had this abdominal pain for a while but states it began bothering her more than the past 2 days.  She says its on the surface of her belly where she has a small lesion, she describes it as burning and radiating out across her skin.  She reports it usually gets better with lidocaine patches.  She states that her shortness of breath is anxiety related and she feels like her chest is heavy and tight.  She states that she has had some difficulty getting into see a primary care provider but thinks she has an appointment scheduled in December.  She explains that the ED has been very helpful in getting her Ativan to manage her anxiety while she waits to get into her primary care.     Physical Exam   Triage Vital Signs: ED Triage Vitals [09/09/23 1333]  Encounter Vitals Group     BP (!) 140/96     Systolic BP Percentile      Diastolic BP Percentile      Pulse Rate 100     Resp 18     Temp (!) 97.5 F (36.4 C)     Temp src      SpO2 98 %     Weight 160 lb (72.6 kg)     Height 5\' 4"  (1.626 m)     Head Circumference      Peak Flow      Pain Score 8     Pain Loc      Pain Education      Exclude from Growth Chart     Most recent vital signs: Vitals:   09/09/23 1333 09/09/23 1620  BP: (!) 140/96 118/81  Pulse: 100 91  Resp: 18 17  Temp: (!) 97.5 F (36.4 C)   SpO2: 98% 93%   General: Awake, moderate distress, tearful, anxious. CV:  Good peripheral perfusion.  RRR. Resp:  Normal effort.  CTAB. Abd:  No distention.  Approximately 1 cm in diameter excoriation on abdomen, with no surrounding erythema, induration or fluctuance, no  TTP, tingling resolves with pressure, soft, bowel sounds appropriate. Other:     ED Results / Procedures / Treatments   Labs (all labs ordered are listed, but only abnormal results are displayed) Labs Reviewed  COMPREHENSIVE METABOLIC PANEL - Abnormal; Notable for the following components:      Result Value   Glucose, Bld 127 (*)    Total Protein 8.2 (*)    All other components within normal limits  LIPASE, BLOOD  CBC  URINALYSIS, ROUTINE W REFLEX MICROSCOPIC    PROCEDURES:  Critical Care performed: No  Procedures   MEDICATIONS ORDERED IN ED: Medications  albuterol (PROVENTIL) (2.5 MG/3ML) 0.083% nebulizer solution 2.5 mg (2.5 mg Nebulization Given 09/09/23 1726)  LORazepam (ATIVAN) tablet 0.5 mg (0.5 mg Oral Given 09/09/23 1751)     IMPRESSION / MDM / ASSESSMENT AND PLAN / ED COURSE  I reviewed the triage vital signs and the nursing notes.  57 year old female presents for evaluation of shortness of breath and abdominal pain.  Vital signs stable in triage and patient was in moderate distress on exam.  She verbalized feeling very anxious and was tearful at times.  Differential diagnosis includes, but is not limited to, anxiety, fibromyalgia exacerbation, ulcer, COPD exacerbation, benzodiazepine withdrawal.  Patient's presentation is most consistent with acute complicated illness / injury requiring diagnostic workup.  CMP, lipase and CBC were unremarkable.  Patient's physical exam was reassuring.  I did discuss getting a CT scan to evaluate patient's abdominal pain.  She declined at this time as she feels it is related to her anxiety and fibromyalgia.  I did explain that she can return to the ED at anytime for imaging.  She voiced understanding.  Patient was given an albuterol nebulizer treatment to help with her chest tightness, which she reported some improvement in after this.  I feel that patient's symptoms are best attributed to anxiety  which she was in agreement with.  I will refill her prescription for Ativan and she was given a dose while in the ED.  She has an appointment set up for a primary care provider in December but I will also provide her with information for behavioral health.  She was agreeable to plan, voiced understanding, all questions were answered and she was stable at discharge.      FINAL CLINICAL IMPRESSION(S) / ED DIAGNOSES   Final diagnoses:  Anxiety     Rx / DC Orders   ED Discharge Orders          Ordered    LORazepam (ATIVAN) 0.5 MG tablet  2 times daily        09/09/23 1738             Note:  This document was prepared using Dragon voice recognition software and may include unintentional dictation errors.   Cameron Ali, PA-C 09/09/23 1755    Chesley Noon, MD 09/09/23 [phone]

## 2023-09-09 NOTE — ED Notes (Signed)
Pt reports to this RN that she ran out of ativan and has not been able to find a doctor

## 2023-09-09 NOTE — Discharge Instructions (Signed)
Please keep your appointment with your primary care provider.  I have also attached information for the behavioral health center below, who you could reach out to for further management of your anxiety if you are unable to get a sooner appointment.  Please keep in mind that Ativan can be sedating so you should not drive after taking this medication.  [facility] [address] [place], [place] [address] [phone]

## 2023-09-24 IMAGING — CT CT ANGIO CHEST
2 of 6 series · 18 of 46 positions shown · IV contrast (APPLIED)
Comparison: CT done on 04/07/2016, chest radiograph done earlier
today

CLINICAL DATA: Chest pain, palpitations, positive D-dimer

EXAM:
CT ANGIOGRAPHY CHEST WITH CONTRAST
TECHNIQUE: Multidetector CT imaging of the chest was performed using the
standard protocol during bolus administration of intravenous
contrast. Multiplanar CT image reconstructions and MIPs were
obtained to evaluate the vascular anatomy.

[Series 6: thins · axial · 0.71mm/px · z∈[-652,-387]mm · 15 of 416 slices shown]
[im 19/416  lung]
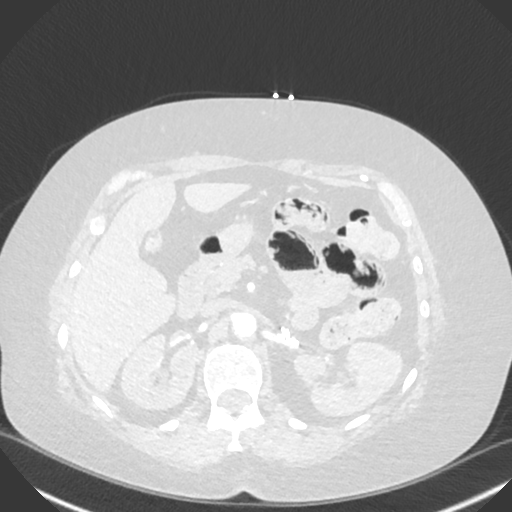
[im 55/416  soft-tissue]
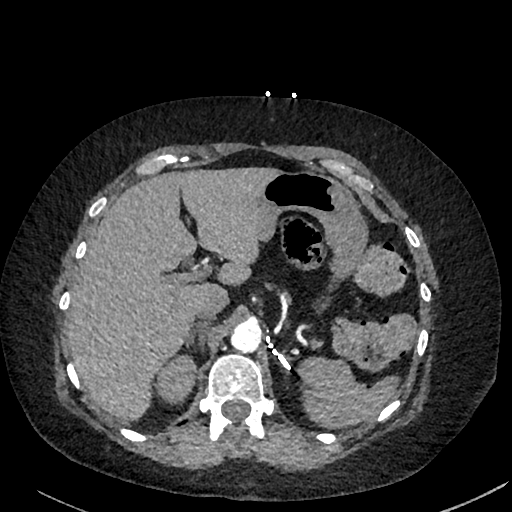
[im 73/416  lung]
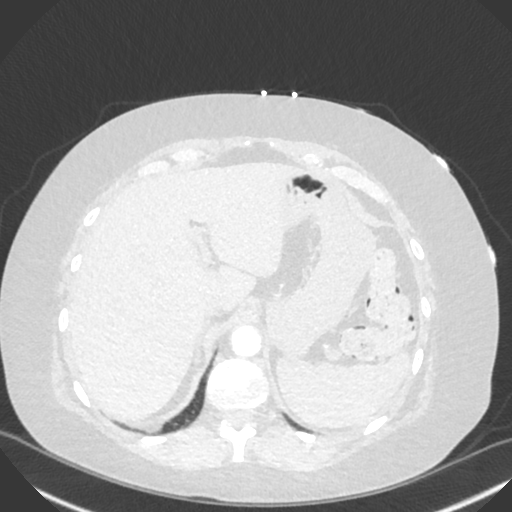
[im 109/416  soft-tissue]
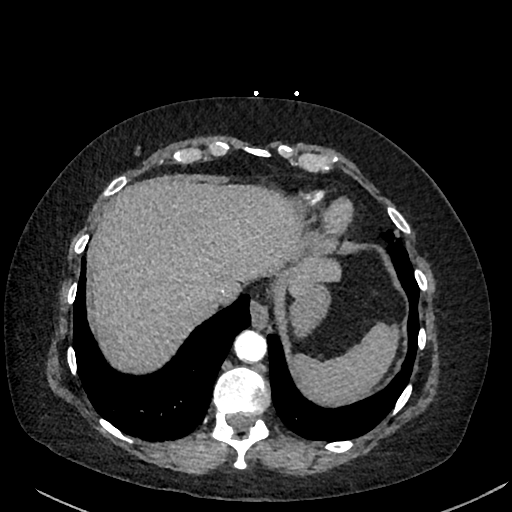
[im 127/416  lung]
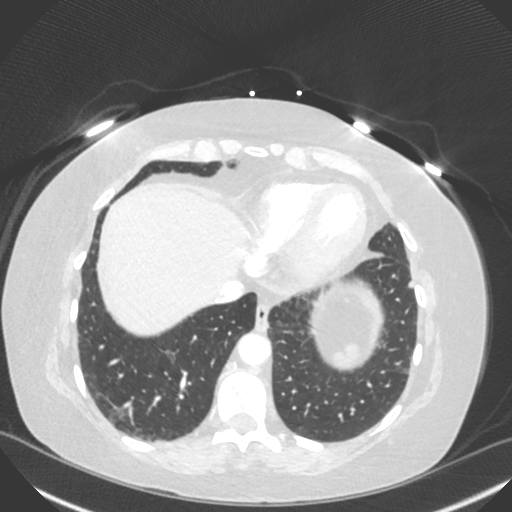
[im 163/416  soft-tissue]
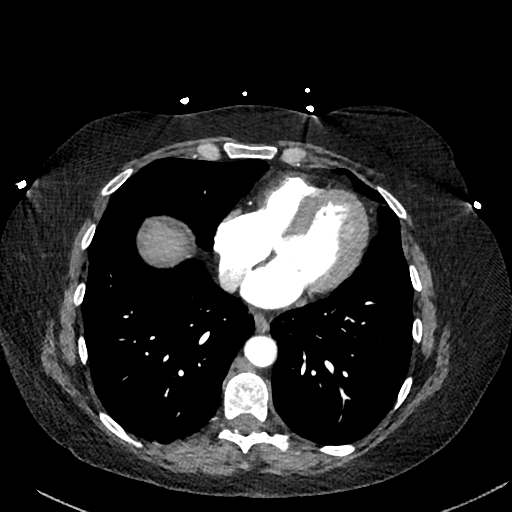
[im 181/416  lung]
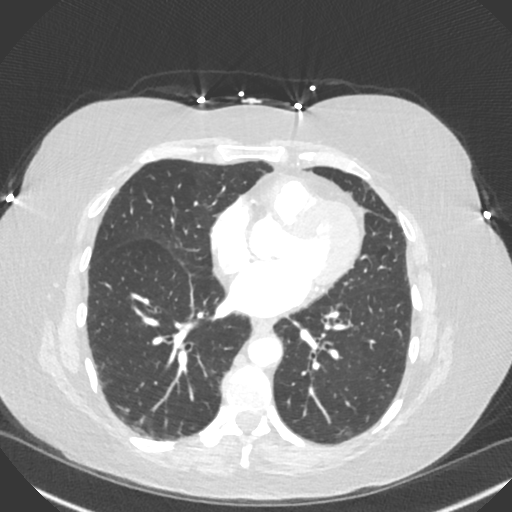
[im 217/416  soft-tissue]
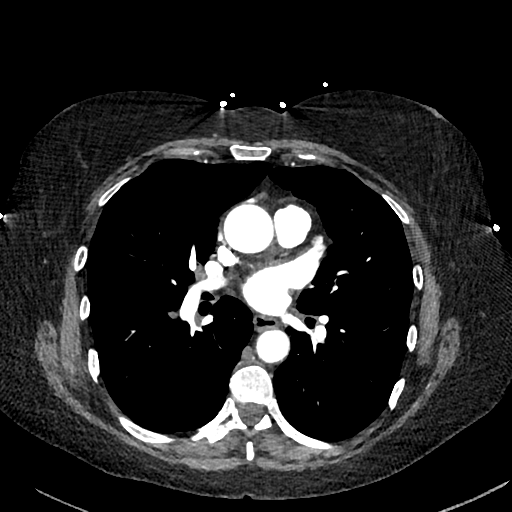
[im 235/416  lung]
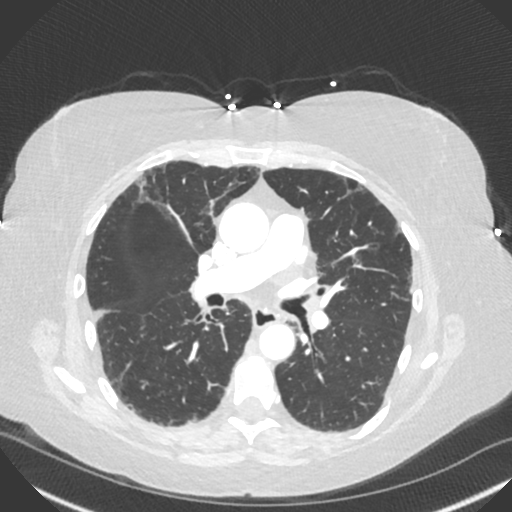
[im 253/416  soft-tissue]
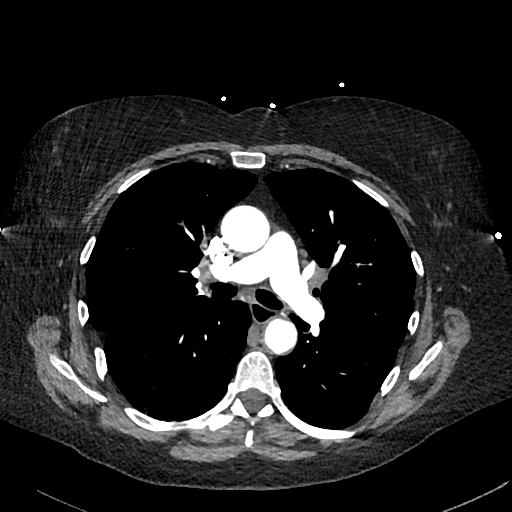
[im 289/416  lung]
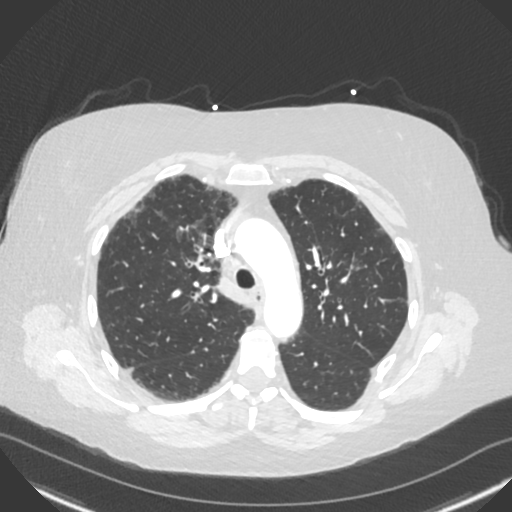
[im 307/416  soft-tissue]
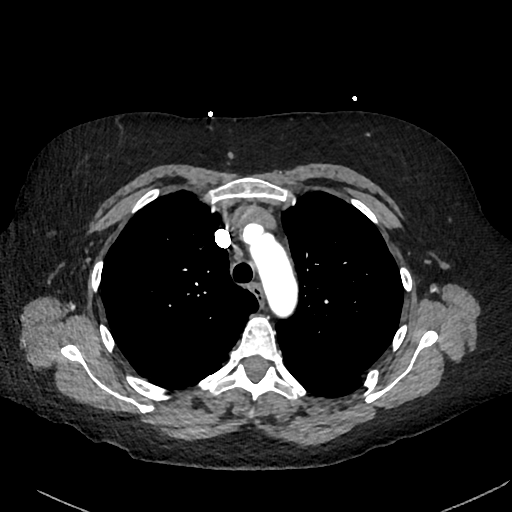
[im 343/416  lung]
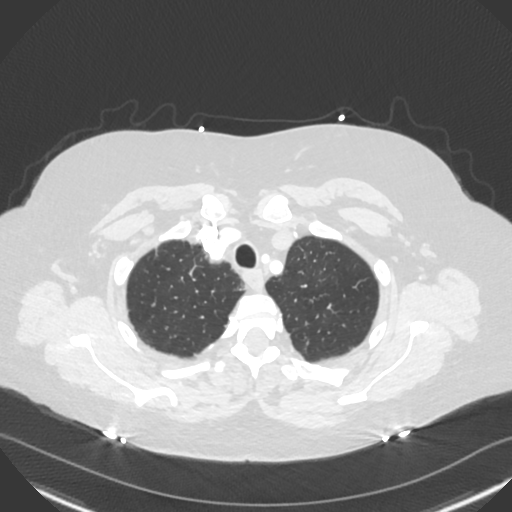
[im 361/416  soft-tissue]
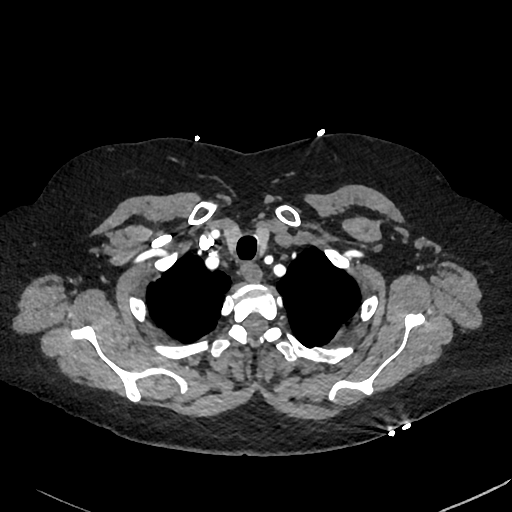
[im 397/416  lung]
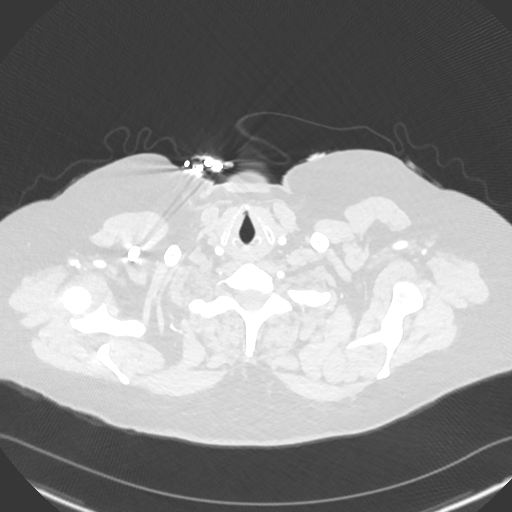

[Series 7: cor · coronal · 0.60mm/px · 3 of 138 slices shown]
[im 35/138  soft-tissue]
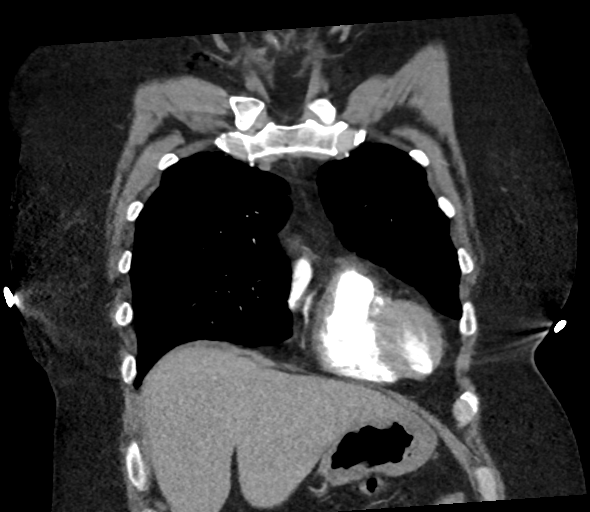
[im 69/138  soft-tissue]
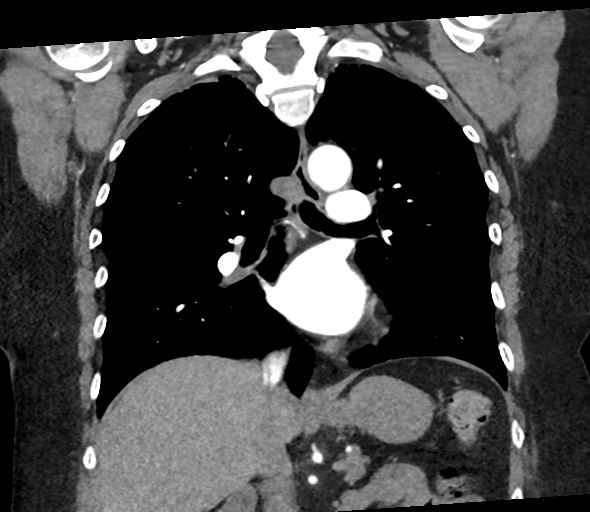
[im 103/138  soft-tissue]
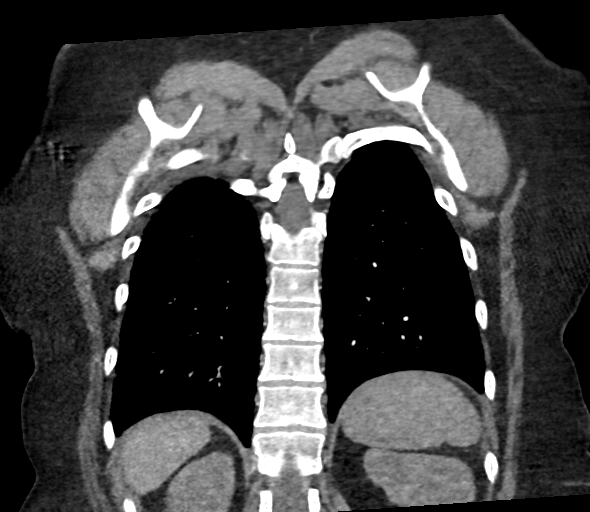

[18 of 46 positions shown; findings below may reference images not displayed]

RADIATION DOSE REDUCTION: This exam was performed according to the
departmental dose-optimization program which includes automated
exposure control, adjustment of the mA and/or kV according to
patient size and/or use of iterative reconstruction technique.

CONTRAST:  75mL OMNIPAQUE IOHEXOL 350 MG/ML SOLN
FINDINGS: Cardiovascular: There is homogeneous enhancement in thoracic aorta.
Ascending thoracic aorta measures 3.4 cm. There are no intraluminal
filling defects in the pulmonary artery branches.

Mediastinum/Nodes: There are slightly enlarged lymph nodes in the
mediastinum and both hilar regions.

Lungs/Pleura: There is no focal pulmonary consolidation. There is
centrilobular emphysema. Increased interstitial markings are seen in
the periphery of both lungs with interval worsening. There is 4 mm
pleural-based nodular density in the left lower lobe which appears
stable. No new lung nodules are seen. There is no pleural effusion
or pneumothorax.

Upper Abdomen: Surgical clips are seen in gallbladder fossa. There
is no dilation of bile ducts.

Musculoskeletal: Unremarkable.

Review of the MIP images confirms the above findings.
IMPRESSION: There is no evidence of pulmonary artery embolism. There is no
evidence of thoracic aortic dissection.

COPD. There is prominence of interstitial markings in the periphery
of both lungs suggesting interstitial lung disease. No focal
pulmonary infiltrates are seen.

## 2023-09-27 ENCOUNTER — Other Ambulatory Visit (HOSPITAL_BASED_OUTPATIENT_CLINIC_OR_DEPARTMENT_OTHER): Payer: Self-pay

## 2023-09-27 ENCOUNTER — Ambulatory Visit (HOSPITAL_BASED_OUTPATIENT_CLINIC_OR_DEPARTMENT_OTHER): Payer: Medicare Other | Admitting: Family Medicine

## 2023-09-27 ENCOUNTER — Ambulatory Visit (HOSPITAL_BASED_OUTPATIENT_CLINIC_OR_DEPARTMENT_OTHER): Payer: Medicare Other

## 2023-09-27 VITALS — BP 148/96 | HR 105 | Ht 64.0 in | Wt 207.6 lb

## 2023-09-27 DIAGNOSIS — F17210 Nicotine dependence, cigarettes, uncomplicated: Secondary | ICD-10-CM

## 2023-09-27 DIAGNOSIS — R1084 Generalized abdominal pain: Secondary | ICD-10-CM

## 2023-09-27 DIAGNOSIS — K5909 Other constipation: Secondary | ICD-10-CM

## 2023-09-27 DIAGNOSIS — J449 Chronic obstructive pulmonary disease, unspecified: Secondary | ICD-10-CM

## 2023-09-27 DIAGNOSIS — K219 Gastro-esophageal reflux disease without esophagitis: Secondary | ICD-10-CM | POA: Diagnosis not present

## 2023-09-27 DIAGNOSIS — R7303 Prediabetes: Secondary | ICD-10-CM

## 2023-09-27 DIAGNOSIS — F411 Generalized anxiety disorder: Secondary | ICD-10-CM | POA: Diagnosis not present

## 2023-09-27 DIAGNOSIS — M069 Rheumatoid arthritis, unspecified: Secondary | ICD-10-CM

## 2023-09-27 DIAGNOSIS — F410 Panic disorder [episodic paroxysmal anxiety] without agoraphobia: Secondary | ICD-10-CM | POA: Diagnosis not present

## 2023-09-27 DIAGNOSIS — K2100 Gastro-esophageal reflux disease with esophagitis, without bleeding: Secondary | ICD-10-CM

## 2023-09-27 DIAGNOSIS — Z7689 Persons encountering health services in other specified circumstances: Secondary | ICD-10-CM

## 2023-09-27 LAB — POCT URINE DRUG SCREEN - MANUAL ENTRY (I-SCREEN)
Benzodiazepam Lvl: POSITIVE
POC Amphetamine UR: NOT DETECTED
POC Buprenorphine (BUP): NOT DETECTED
POC Cocaine UR: NOT DETECTED
POC Marijuana UR: NOT DETECTED
POC Methadone UR: NOT DETECTED
POC Methamphetamine UR: NOT DETECTED
POC Morphine: NOT DETECTED
POC Oxazepam (BZO): NOT DETECTED
POC Oxycodone UR: NOT DETECTED
POC Secobarbital (BAR): NOT DETECTED

## 2023-09-27 LAB — POCT GLYCOSYLATED HEMOGLOBIN (HGB A1C): Hemoglobin A1C: 5.6 % (ref 4.0–5.6)

## 2023-09-27 MED ORDER — IPRATROPIUM-ALBUTEROL 0.5-2.5 (3) MG/3ML IN SOLN
3.0000 mL | RESPIRATORY_TRACT | 5 refills | Status: AC | PRN
  Filled 2023-09-27: qty 90, 5d supply, fill #0

## 2023-09-27 MED ORDER — VENLAFAXINE HCL ER 75 MG PO CP24
75.0000 mg | ORAL_CAPSULE | Freq: Every day | ORAL | 2 refills | Status: DC
  Filled 2023-09-27: qty 30, 30d supply, fill #0

## 2023-09-27 MED ORDER — VENLAFAXINE HCL ER 150 MG PO CP24
150.0000 mg | ORAL_CAPSULE | Freq: Every day | ORAL | 2 refills | Status: DC
  Filled 2023-09-27: qty 30, 30d supply, fill #0

## 2023-09-27 MED ORDER — LORAZEPAM 0.5 MG PO TABS
0.5000 mg | ORAL_TABLET | Freq: Two times a day (BID) | ORAL | 0 refills | Status: DC
  Filled 2023-09-27: qty 30, 15d supply, fill #0

## 2023-09-27 MED ORDER — ESOMEPRAZOLE MAGNESIUM 40 MG PO CPDR
40.0000 mg | DELAYED_RELEASE_CAPSULE | Freq: Every day | ORAL | 2 refills | Status: DC
  Filled 2023-09-27: qty 30, 30d supply, fill #0

## 2023-09-27 MED ORDER — DOCUSATE SODIUM 100 MG PO CAPS
100.0000 mg | ORAL_CAPSULE | Freq: Two times a day (BID) | ORAL | 2 refills | Status: DC
  Filled 2023-09-27: qty 60, 30d supply, fill #0

## 2023-09-27 MED ORDER — PROPRANOLOL HCL 10 MG PO TABS
10.0000 mg | ORAL_TABLET | Freq: Three times a day (TID) | ORAL | 0 refills | Status: DC
  Filled 2023-09-27: qty 90, 30d supply, fill #0

## 2023-09-27 NOTE — Progress Notes (Unsigned)
New Patient Office Visit  Subjective    Patient ID: Veronica Rangel, female    DOB: 29-May-1966  Age: 57 y.o. MRN: [id number]  HPI Veronica Rangel is a 57 year old female who presents to establish with Primary Care & Sports Medicine at [facility]. She has a past medical history of anxiety & depression, renal cancer, COPD, fibromyalgia, GERD, HTN, RA and DM. She reports having concerns about constipation recently- LBM was about a week ago.  Former PCP: Rodman Pickle, NP   Anxiety- has been to [facility] 3x since beginning of Sept for her anxiety. Reports she suffers from frequent panic attacks & OCD, causing her to pick at her face and scalp.  Ativan 0.5mg  twice daily and 1 mg at bedtime  Effexor 225mg  every day  Patient reports she was referred to psychiatry in the past but reports having "difficulty getting to routine doctor's appts" and states that her husband had to take off from work to drive her to this appt today.   HTN/anxiety- propranolol 10mg  TID   COPD- pulm 2023  Albuterol PRN- reports recently she has been having to use it at least twice daily due to an increase in her wheezing/shortness of breath  Reports she has chronic shob with exertion   Fibromyalgia-   RA- methylprednisolone 40mg  daily PRN   DM- A1c 6.8  Focused on healthy diet   GERD- reports she has not been taking protonix, since it caused her to have headaches She has not tried Nexium but thinks this will work better      09/27/2023    4:17 PM 03/10/2023    1:48 PM 11/08/2022    2:59 PM 05/07/2022    2:28 PM  GAD 7 : Generalized Anxiety Score  Nervous, Anxious, on Edge 3 3 2 3   Control/stop worrying 2 3 2 3   Worry too much - different things 2 3 2 3   Trouble relaxing 3 3 2 3   Restless 0 0 1 3  Easily annoyed or irritable 1 2 2 1   Afraid - awful might happen 3 2 2 1   Total GAD 7 Score 14 16 13 17   Anxiety Difficulty Somewhat difficult  Very difficult Extremely difficult       09/27/2023     4:16 PM 03/10/2023    1:48 PM 11/08/2022    2:57 PM  PHQ9 SCORE ONLY  PHQ-9 Total Score 13 16 13     Outpatient Encounter Medications as of 09/27/2023  Medication Sig   albuterol (VENTOLIN HFA) 108 (90 Base) MCG/ACT inhaler Inhale 2 puffs into the lungs every 6 (six) hours as needed for wheezing or shortness of breath.   docusate sodium (DULCOLAX PINK STOOL SOFTENER) 100 MG capsule Take 1 capsule (100 mg total) by mouth 2 (two) times daily.   ipratropium-albuterol (DUONEB) 0.5-2.5 (3) MG/3ML SOLN Take 3 mLs by nebulization every 4 (four) hours as needed.   methylPREDNISolone (MEDROL DOSEPAK) 4 MG TBPK tablet Take according to pack   [DISCONTINUED] LORazepam (ATIVAN) 0.5 MG tablet Take 1 tablet (0.5 mg total) by mouth 2 (two) times daily.   [DISCONTINUED] pantoprazole (PROTONIX) 40 MG tablet Take 40 mg by mouth daily.   [DISCONTINUED] propranolol (INDERAL) 10 MG tablet Take 1 tablet (10 mg total) by mouth 3 (three) times daily.   [DISCONTINUED] venlafaxine XR (EFFEXOR XR) 75 MG 24 hr capsule Take 1 capsule (75 mg total) by mouth daily with breakfast. Take with venlafaxine 150mg  to equal 225mg  daily   [DISCONTINUED] venlafaxine XR (EFFEXOR-XR)  150 MG 24 hr capsule Take 1 capsule (150 mg total) by mouth daily with breakfast. Take with venlafaxine 75mg  to equal venlafaxine 225mg  daily   esomeprazole (NEXIUM) 40 MG capsule Take 1 capsule (40 mg total) by mouth daily.   LORazepam (ATIVAN) 0.5 MG tablet Take 1 tablet (0.5 mg total) by mouth 2 (two) times daily for 15 days.   propranolol (INDERAL) 10 MG tablet Take 1 tablet (10 mg total) by mouth 3 (three) times daily.   venlafaxine XR (EFFEXOR XR) 75 MG 24 hr capsule Take 1 capsule (75 mg total) by mouth daily with breakfast. Take with venlafaxine 150mg  to equal 225mg  daily   venlafaxine XR (EFFEXOR-XR) 150 MG 24 hr capsule Take 1 capsule (150 mg total) by mouth daily with breakfast. Take with venlafaxine 75mg  to equal venlafaxine 225mg  daily    [DISCONTINUED] esomeprazole (NEXIUM) 40 MG capsule Take 1 capsule (40 mg total) by mouth daily. (Patient not taking: Reported on 09/27/2023)   [DISCONTINUED] methocarbamol (ROBAXIN) 500 MG tablet Take 1 tablet (500 mg total) by mouth every 8 (eight) hours as needed for muscle spasms. (Patient not taking: Reported on 03/10/2023)   [DISCONTINUED] nortriptyline (PAMELOR) 10 MG capsule Take 1 capsule (10 mg total) by mouth at bedtime. (Patient not taking: Reported on 03/10/2023)   [DISCONTINUED] propranolol (INDERAL) 10 MG tablet Take 1 tablet (10 mg total) by mouth 3 (three) times daily. (Patient not taking: Reported on 09/27/2023)   No facility-administered encounter medications on file as of 09/27/2023.    Past Medical History:  Diagnosis Date   Anxiety    Cancer (HCC)    renal   COPD (chronic obstructive pulmonary disease) (HCC)    Depression    Fibromyalgia    GERD (gastroesophageal reflux disease)    Hypertension    RA (rheumatoid arthritis) (HCC)    Shingles     Past Surgical History:  Procedure Laterality Date   ADRENALECTOMY Left    APPENDECTOMY     CESAREAN SECTION     CHOLECYSTECTOMY     NEPHRECTOMY RADICAL     TUBAL LIGATION      Family History  Problem Relation Age of Onset   Cancer Father        lung   Heart disease Maternal Grandfather    Diabetes Maternal Grandfather     Review of Systems  Constitutional:  Negative for malaise/fatigue and weight loss.  HENT:  Negative for congestion.   Eyes:  Negative for blurred vision and double vision.  Respiratory:  Positive for shortness of breath. Negative for cough and wheezing.   Cardiovascular:  Negative for chest pain, palpitations and leg swelling.  Gastrointestinal:  Positive for abdominal pain and constipation. Negative for diarrhea, heartburn, nausea and vomiting.  Musculoskeletal:  Positive for joint pain and myalgias.  Skin:  Positive for itching (of scalp). Negative for rash.  Neurological:  Negative for  dizziness, weakness and headaches.  Psychiatric/Behavioral:  Negative for depression and suicidal ideas. The patient is not nervous/anxious and does not have insomnia.         Objective    BP (!) 148/96 Comment: Repeat BP  Pulse (!) 105   Ht 5\' 4"  (1.626 m)   Wt 207 lb 9.6 oz (94.2 kg)   SpO2 98%   BMI 35.63 kg/m   Physical Exam Vitals reviewed.  Constitutional:      Appearance: Normal appearance.  HENT:     Head: Hair is abnormal (scalp with various scabs present).  Cardiovascular:  Rate and Rhythm: Regular rhythm. Tachycardia present.     Pulses: Normal pulses.     Heart sounds: Normal heart sounds.  Pulmonary:     Effort: Pulmonary effort is normal.     Breath sounds: Examination of the right-lower field reveals decreased breath sounds. Examination of the left-lower field reveals decreased breath sounds. Decreased breath sounds present.  Abdominal:     General: Abdomen is protuberant. Bowel sounds are decreased.     Palpations: Abdomen is soft.     Tenderness: There is abdominal tenderness in the right upper quadrant.  Musculoskeletal:     Right hand: Bony tenderness present. Decreased range of motion.     Left hand: Bony tenderness present. Decreased range of motion.  Skin:    Findings: Wound (on scalp from frequent picking) present.  Neurological:     Mental Status: She is alert.  Psychiatric:        Attention and Perception: She is inattentive.        Mood and Affect: Mood is anxious. Affect is flat.        Speech: Speech is rapid and pressured.        Behavior: Behavior normal.        Thought Content: Thought content normal.        Cognition and Memory: Cognition normal.     Assessment & Plan:   1. Encounter to establish care Patient is a 82- year-old female female who presents today to establish care with primary care at [facility]. Reviewed the past medical history, family history, surgical history, medications and allergies today- updates made  as indicated. She has various concerns today, with the most bothersome being her uncontrolled anxiety.   2. Gastroesophageal reflux disease, unspecified whether esophagitis present Review of note from [facility] on 03/10/2023- patient complaining at this time of ongoing abdominal pain over the past year. She reports today that it improves with lying down and heat. Previous CT of abdomen was negative. Patient reports she has not started nortriptyline still, encouraged patient to take 10mg  at bedtime. Could be related to anxiety, fibromyalgia, and/or acid reflux. Placed referral to GI for ongoing abdominal pain and uncontrolled GERD symptoms.   - Ambulatory referral to Gastroenterology - esomeprazole (NEXIUM) 40 MG capsule; Take 1 capsule (40 mg total) by mouth daily.  Dispense: 30 capsule; Refill: 2  3. Prediabetes A1c today is 5.6. In the past, has fluctuated between the pre-diabetic and diabetic range- including results at 6.0 about 6 months ago and 6.8 about 10 months ago. Discussed continuing with lifestyle modifications- including healthy nutrition and frequent exercise for at least 30 minutes three times weekly.  - POCT HgB A1C  4. Generalized abdominal pain See #2 - Ambulatory referral to Gastroenterology  5. Panic anxiety syndrome GAD7 completed with score of 14. Patient is currently taking venlafaxine 225mg  daily without adequate management of her anxiety. Patient appears anxious during visit and her speech is rapid and going between topics quickly. Patient reports she has been on Ativan in the past with relief of her anxiety- taking 0.5mg  with breakfast and lunch, and 1 mg at bedtime. Encouraged patient to start nortriptyline 10mg  at bedtime that was prescribed in April but patient has never taken it. POCT UDS completed in office today, only positive for benzos. PDMP reviewed, no red flags present. Referral placed to psychiatry for further management.  - Ambulatory referral to Psychiatry -  LORazepam (ATIVAN) 0.5 MG tablet; Take 1 tablet (0.5 mg total) by mouth 2 (  two) times daily for 15 days.  Dispense: 30 tablet; Refill: 0 - POCT Urine Drug Screen - (I-Screen)  6. Rheumatoid arthritis involving both hands, unspecified whether rheumatoid factor present [facility]) Patient reports she has a history of RA. Physical exam with significant MCP joint deformities of both hands. Patient does not take any medication at this time. Review of past medications- appears that she has been on meloxicam PRN. Advised patient to reach out if this becomes bothersome.   7. GAD (generalized anxiety disorder) See #5 - propranolol (INDERAL) 10 MG tablet; Take 1 tablet (10 mg total) by mouth 3 (three) times daily.  Dispense: 270 tablet; Refill: 0 - venlafaxine XR (EFFEXOR-XR) 150 MG 24 hr capsule; Take 1 capsule (150 mg total) by mouth daily with breakfast. Take with venlafaxine 75mg  to equal venlafaxine 225mg  daily  Dispense: 30 capsule; Refill: 2 - venlafaxine XR (EFFEXOR XR) 75 MG 24 hr capsule; Take 1 capsule (75 mg total) by mouth daily with breakfast. Take with venlafaxine 150mg  to equal 225mg  daily  Dispense: 30 capsule; Refill: 2 - Ambulatory referral to Psychiatry - LORazepam (ATIVAN) 0.5 MG tablet; Take 1 tablet (0.5 mg total) by mouth 2 (two) times daily for 15 days.  Dispense: 30 tablet; Refill: 0  8. Chronic constipation Patient reports a "squeezing" pain in her right and left abdominal quadrants. Denies radiation of pain, nausea/vomiting, changes to bowel movements, urinary symptoms, blood in stool. She does report having issues with constipation. She reports using MiraLax OTC without a decent bowel movement. She reports that she has not had a bowel movement in the past week. Bowel sounds are decreased during auscultation. Advised patient to maintain adequate hydration, consume high fiber foods, and to taking stool softeners daily until she has a bowel movement. Will obtain abdominal x-ray today to see  if impaction is present.  - docusate sodium (DULCOLAX PINK STOOL SOFTENER) 100 MG capsule; Take 1 capsule (100 mg total) by mouth 2 (two) times daily.  Dispense: 60 capsule; Refill: 2 - DG Abd 2 Views; Future  9. Chronic obstructive pulmonary disease, unspecified COPD type (HCC) Patient reports shortness of breath with exertion due to history of chronic cigarette smoking. She reports needing to use her albuterol twice daily recently. Advised her to continue to use PRN and if she starts to need more frequently, would be reasonable to start maintenance inhaler for management.  - ipratropium-albuterol (DUONEB) 0.5-2.5 (3) MG/3ML SOLN; Take 3 mLs by nebulization every 4 (four) hours as needed.  Dispense: 90 mL; Refill: 5   Return in about 2 weeks (around 10/11/2023) for HTN follow-up.   Alyson Reedy, FNP

## 2023-09-27 NOTE — Patient Instructions (Signed)

## 2023-09-29 ENCOUNTER — Encounter (HOSPITAL_BASED_OUTPATIENT_CLINIC_OR_DEPARTMENT_OTHER): Payer: Self-pay | Admitting: Family Medicine

## 2023-09-29 DIAGNOSIS — F410 Panic disorder [episodic paroxysmal anxiety] without agoraphobia: Secondary | ICD-10-CM

## 2023-09-29 DIAGNOSIS — F411 Generalized anxiety disorder: Secondary | ICD-10-CM

## 2023-09-30 ENCOUNTER — Other Ambulatory Visit (HOSPITAL_BASED_OUTPATIENT_CLINIC_OR_DEPARTMENT_OTHER): Payer: Self-pay | Admitting: Family Medicine

## 2023-09-30 MED ORDER — LORAZEPAM 0.5 MG PO TABS: 0.5000 mg | ORAL_TABLET | Freq: Three times a day (TID) | ORAL | 0 refills | Status: DC | PRN

## 2023-10-12 ENCOUNTER — Encounter (HOSPITAL_BASED_OUTPATIENT_CLINIC_OR_DEPARTMENT_OTHER): Payer: Self-pay | Admitting: Family Medicine

## 2023-10-12 ENCOUNTER — Ambulatory Visit (INDEPENDENT_AMBULATORY_CARE_PROVIDER_SITE_OTHER): Payer: Medicare Other | Admitting: Family Medicine

## 2023-10-12 VITALS — BP 118/85 | HR 83 | Ht 64.0 in | Wt 206.9 lb

## 2023-10-12 DIAGNOSIS — J439 Emphysema, unspecified: Secondary | ICD-10-CM

## 2023-10-12 DIAGNOSIS — R030 Elevated blood-pressure reading, without diagnosis of hypertension: Secondary | ICD-10-CM | POA: Insufficient documentation

## 2023-10-12 DIAGNOSIS — M069 Rheumatoid arthritis, unspecified: Secondary | ICD-10-CM

## 2023-10-12 MED ORDER — BUDESONIDE-FORMOTEROL FUMARATE 80-4.5 MCG/ACT IN AERO: 2.0000 | INHALATION_SPRAY | Freq: Two times a day (BID) | RESPIRATORY_TRACT | 3 refills | Status: DC

## 2023-10-12 NOTE — Progress Notes (Signed)
Established Patient Office Visit  Subjective   Patient ID: Veronica Rangel, female    DOB: [birth year]  Age: 57 y.o. MRN: [id number]  HYPERTENSION: Veronica Rangel is a 57 year old female patient who presents for the medical management of hypertension.  Patient's current hypertension medication regimen is: propranolol TID, reports sometimes she takes it BID  Patient is currently taking prescribed medications for HTN.  Does not check BP regularly  Exercising Regularly: no, shob with exertion due to history of smoking  Denies headache, dizziness, CP, SHOB, vision changes.    BP Readings from Last 3 Encounters:  10/12/23 118/85  09/27/23 (!) 148/96  09/09/23 (!) 147/72   Review of Systems  Constitutional:  Negative for malaise/fatigue and weight loss.  Respiratory:  Negative for cough and shortness of breath.   Cardiovascular:  Negative for chest pain, palpitations and leg swelling.  Gastrointestinal:  Negative for nausea and vomiting.  Genitourinary:  Negative for frequency and urgency.  Neurological:  Negative for dizziness, weakness and headaches.  Psychiatric/Behavioral:  Negative for depression and suicidal ideas. The patient is nervous/anxious. The patient does not have insomnia.      Objective:     BP 118/85   Pulse 83   Ht 5\' 4"  (1.626 m)   Wt 206 lb 14.4 oz (93.8 kg)   SpO2 98%   BMI 35.51 kg/m  BP Readings from Last 3 Encounters:  10/12/23 118/85  09/27/23 (!) 148/96  09/09/23 (!) 147/72    Physical Exam Vitals reviewed.  Constitutional:      Appearance: Normal appearance.  Cardiovascular:     Rate and Rhythm: Normal rate and regular rhythm.     Pulses: Normal pulses.     Heart sounds: Normal heart sounds.  Pulmonary:     Effort: Pulmonary effort is normal.     Breath sounds: Normal breath sounds.  Musculoskeletal:     Right lower leg: No edema.     Left lower leg: No edema.  Neurological:     Mental Status: She is alert.  Psychiatric:        Mood and  Affect: Mood is anxious.        Speech: Speech is rapid and pressured.        Behavior: Behavior normal.     Assessment & Plan:   1. Elevated blood pressure reading in office without diagnosis of hypertension Patient presents today with slightly elevated blood pressure. Patient in no acute distress and is well-appearing. Denies chest pain, shortness of breath, lower extremity edema, vision changes, headaches. Cardiovascular exam with heart regular rate and rhythm. Normal heart sounds, no murmurs present. No lower extremity edema present. Lungs clear to auscultation bilaterally. Patient is currently taking propranolol TID. Counseled patient to check blood pressures at home to determine her BP range.   2. Pulmonary emphysema, unspecified emphysema type (HCC) Patient reports she has a history of cigarette smoking and has quit smoking for the past 6 months or so. She reports not needing her albuterol often but reports feeling more shortness of breath. Patient would benefit from Symbicort. Order sent to pharmacy. She has an appt scheduled with pulmonary.  - budesonide-formoterol (SYMBICORT) 80-4.5 MCG/ACT inhaler; Inhale 2 puffs into the lungs 2 (two) times daily.  Dispense: 1 each; Refill: 3  3. Rheumatoid arthritis involving both hands, unspecified whether rheumatoid factor present (HCC) Patient reports she does have a history of RA and is taking Goodys powder PRN. Discussed pharmacotherapy and referral to rheum. Patient reports she will consider.  Return in about 3 months (around 01/12/2024) for Mood f/u.    Alyson Reedy, FNP

## 2023-10-12 NOTE — Patient Instructions (Signed)

## 2023-10-13 ENCOUNTER — Encounter (HOSPITAL_BASED_OUTPATIENT_CLINIC_OR_DEPARTMENT_OTHER): Payer: Self-pay | Admitting: Family Medicine

## 2023-10-14 ENCOUNTER — Encounter (HOSPITAL_BASED_OUTPATIENT_CLINIC_OR_DEPARTMENT_OTHER): Payer: Self-pay | Admitting: Family Medicine

## 2023-10-17 ENCOUNTER — Other Ambulatory Visit (HOSPITAL_BASED_OUTPATIENT_CLINIC_OR_DEPARTMENT_OTHER): Payer: Self-pay | Admitting: Family Medicine

## 2023-10-17 DIAGNOSIS — F410 Panic disorder [episodic paroxysmal anxiety] without agoraphobia: Secondary | ICD-10-CM

## 2023-10-17 DIAGNOSIS — F411 Generalized anxiety disorder: Secondary | ICD-10-CM

## 2023-10-17 MED ORDER — LORAZEPAM 0.5 MG PO TABS: 0.5000 mg | ORAL_TABLET | Freq: Three times a day (TID) | ORAL | 0 refills | Status: DC | PRN

## 2023-10-17 NOTE — Telephone Encounter (Signed)
PDMP reviewed. No red flags present. Refill sent for patient to take TID PRN 30-day supply to pharmacy on file.

## 2023-10-18 IMAGING — CR DG CHEST 2V
2 series · 2 of 2 positions shown · non-contrast
Comparison: April 27, 2022.

CLINICAL DATA: Dyspnea.

EXAM:
CHEST - 2 VIEW

[chest pa]
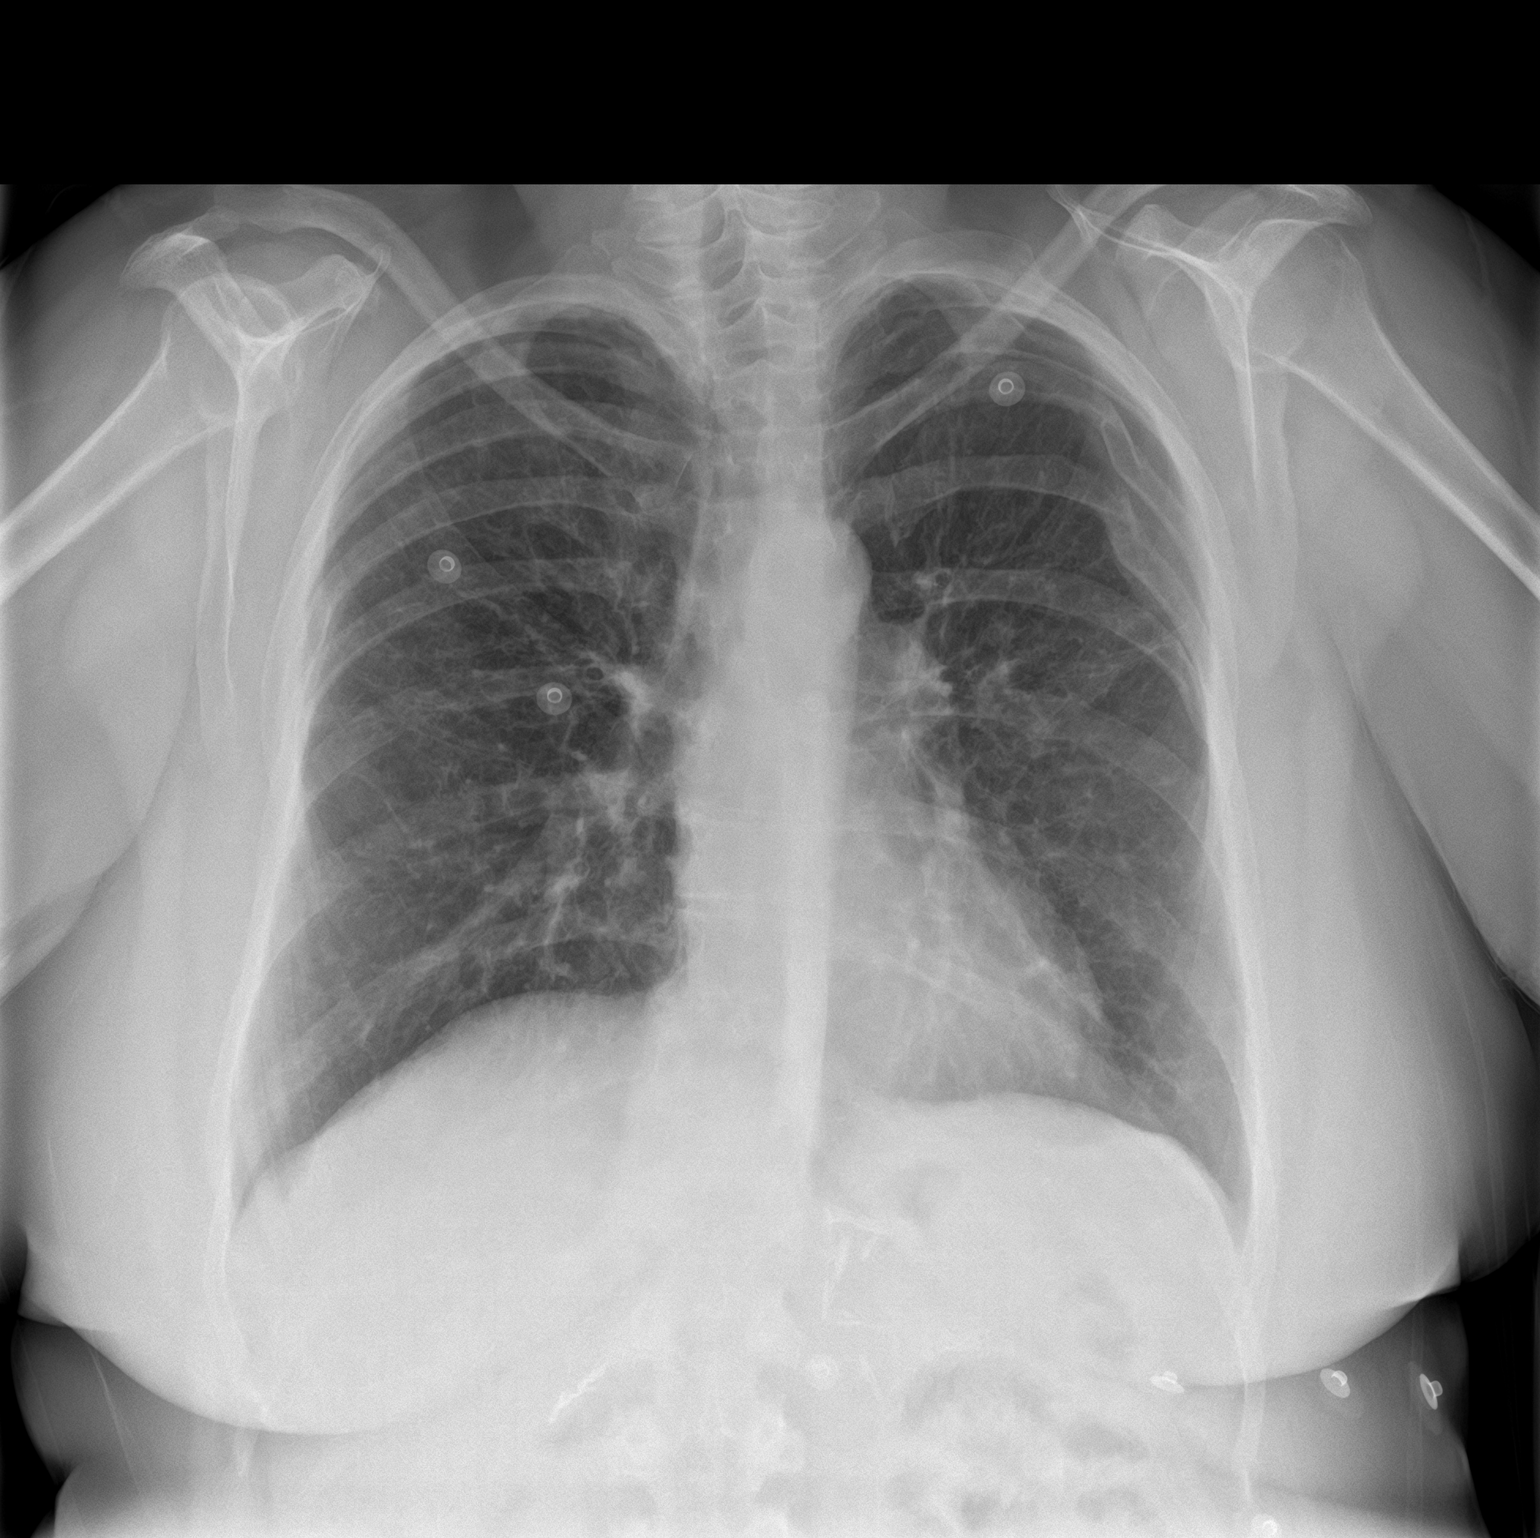

[chest lat]
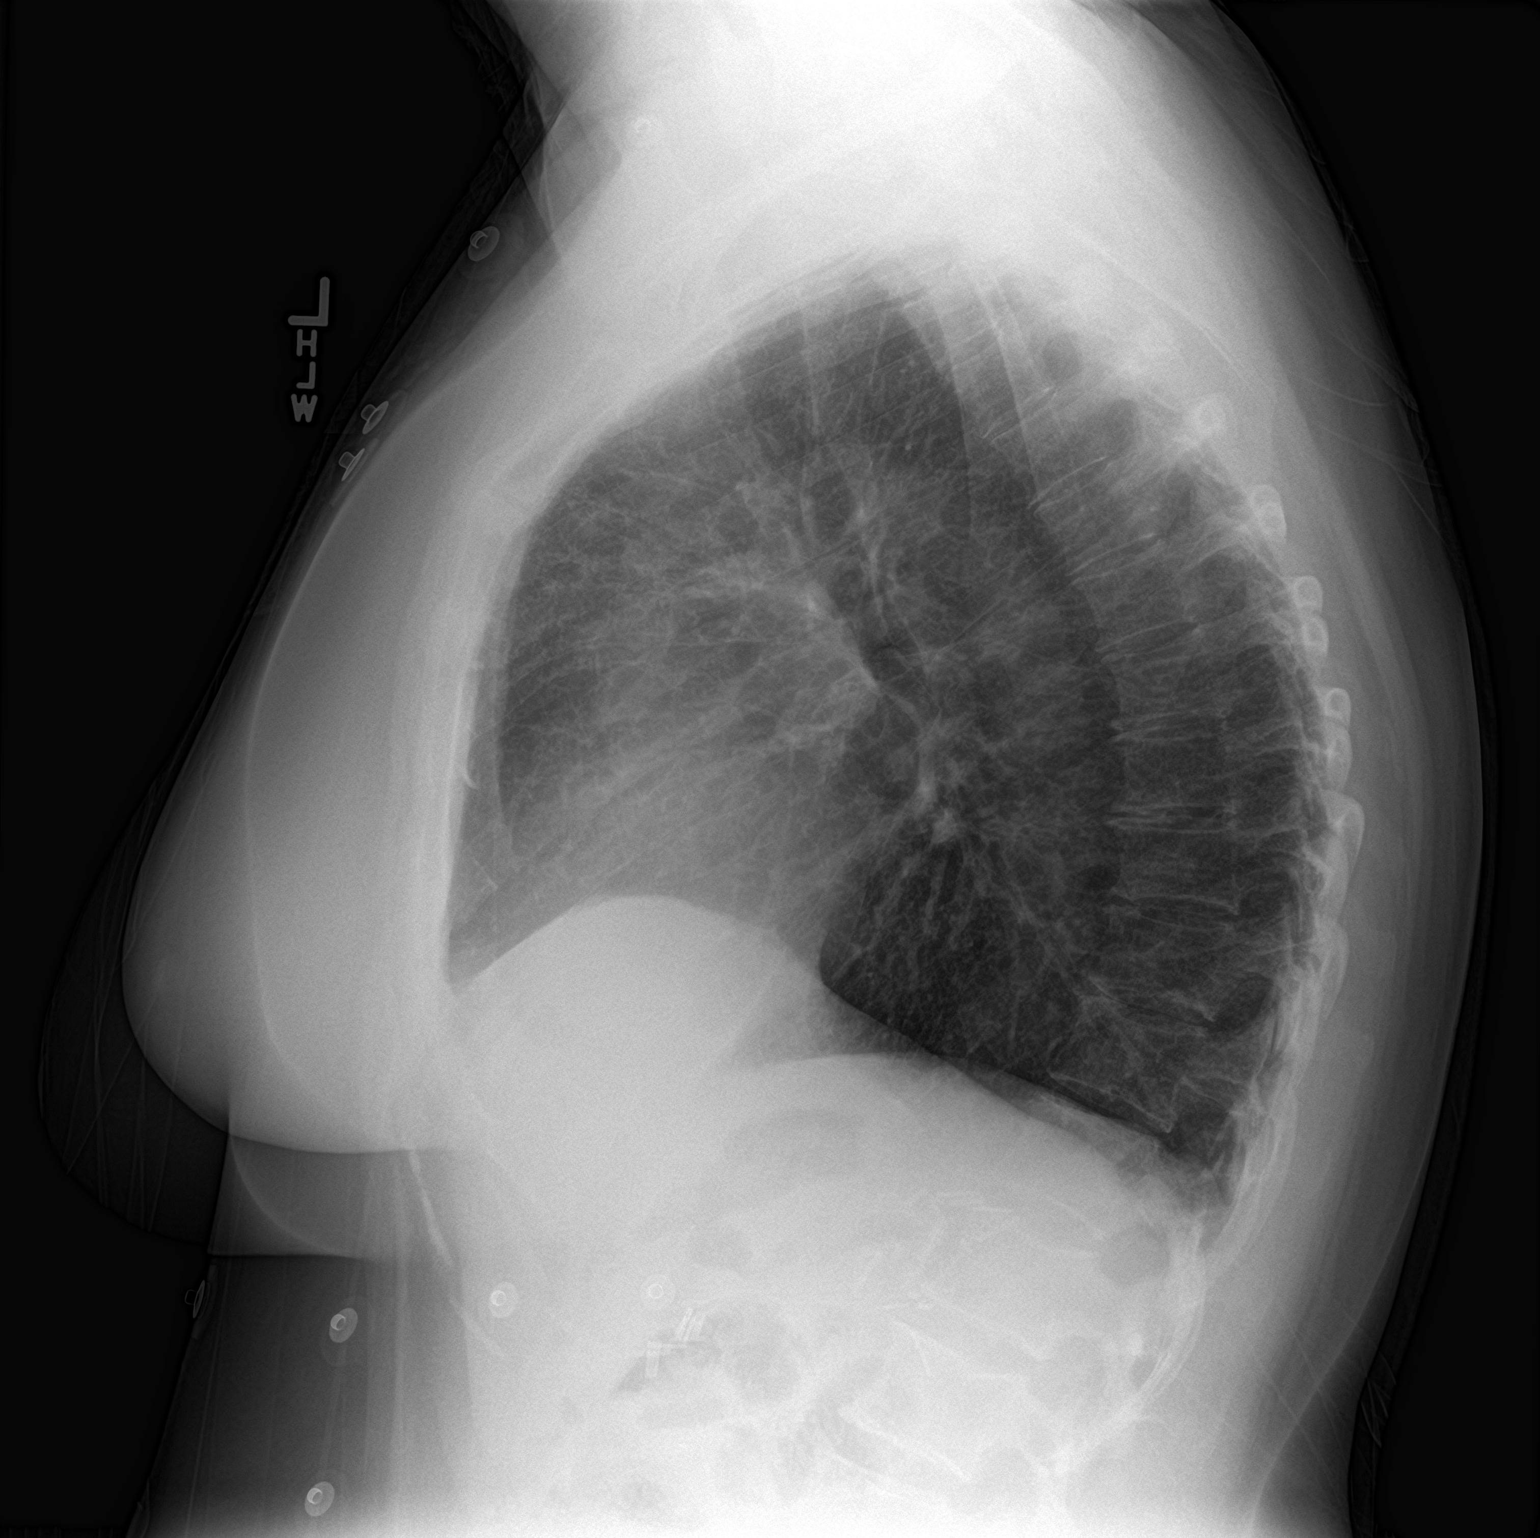

[2 of 2 positions shown; findings below may reference images not displayed]

FINDINGS: The heart size and mediastinal contours are within normal limits.
Old left rib fractures are noted. Stable probable scarring is noted
throughout both lungs. No acute pulmonary disease is noted.
IMPRESSION: No active cardiopulmonary disease.

## 2023-10-27 ENCOUNTER — Other Ambulatory Visit (HOSPITAL_BASED_OUTPATIENT_CLINIC_OR_DEPARTMENT_OTHER): Payer: Self-pay | Admitting: Family Medicine

## 2023-10-27 ENCOUNTER — Encounter (HOSPITAL_BASED_OUTPATIENT_CLINIC_OR_DEPARTMENT_OTHER): Payer: Self-pay | Admitting: Family Medicine

## 2023-10-27 DIAGNOSIS — F411 Generalized anxiety disorder: Secondary | ICD-10-CM

## 2023-10-27 DIAGNOSIS — F410 Panic disorder [episodic paroxysmal anxiety] without agoraphobia: Secondary | ICD-10-CM

## 2023-10-27 MED ORDER — PROPRANOLOL HCL 10 MG PO TABS: 10.0000 mg | ORAL_TABLET | Freq: Three times a day (TID) | ORAL | 3 refills | Status: DC

## 2023-10-27 MED ORDER — LORAZEPAM 0.5 MG PO TABS: 0.5000 mg | ORAL_TABLET | Freq: Three times a day (TID) | ORAL | 0 refills | Status: DC | PRN

## 2023-10-27 NOTE — Progress Notes (Signed)
PDMP reviewed. No red flags present. Patient is taking medication three times daily, since she already needs a refill. Explained to patient that this should be taken as needed. Provided limited supply. Referral placed for psychiatry.

## 2023-11-08 ENCOUNTER — Encounter (HOSPITAL_BASED_OUTPATIENT_CLINIC_OR_DEPARTMENT_OTHER): Payer: Self-pay | Admitting: Family Medicine

## 2023-11-08 DIAGNOSIS — M069 Rheumatoid arthritis, unspecified: Secondary | ICD-10-CM

## 2023-11-11 MED ORDER — METHYLPREDNISOLONE 4 MG PO TBPK: ORAL_TABLET | ORAL | 0 refills | Status: DC

## 2023-11-14 ENCOUNTER — Encounter (HOSPITAL_BASED_OUTPATIENT_CLINIC_OR_DEPARTMENT_OTHER): Payer: Self-pay | Admitting: Family Medicine

## 2023-11-14 NOTE — Telephone Encounter (Signed)
Veronica Rangel, please see mychart message sent by pt and advise.

## 2023-11-17 ENCOUNTER — Other Ambulatory Visit (HOSPITAL_BASED_OUTPATIENT_CLINIC_OR_DEPARTMENT_OTHER): Payer: Self-pay | Admitting: Family Medicine

## 2023-11-17 DIAGNOSIS — F411 Generalized anxiety disorder: Secondary | ICD-10-CM

## 2023-11-17 DIAGNOSIS — F410 Panic disorder [episodic paroxysmal anxiety] without agoraphobia: Secondary | ICD-10-CM

## 2023-11-17 MED ORDER — LORAZEPAM 0.5 MG PO TABS: 0.5000 mg | ORAL_TABLET | Freq: Three times a day (TID) | ORAL | 0 refills | Status: DC | PRN

## 2023-11-28 ENCOUNTER — Other Ambulatory Visit (HOSPITAL_BASED_OUTPATIENT_CLINIC_OR_DEPARTMENT_OTHER): Payer: Self-pay | Admitting: Family Medicine

## 2023-11-28 ENCOUNTER — Encounter (HOSPITAL_BASED_OUTPATIENT_CLINIC_OR_DEPARTMENT_OTHER): Payer: Self-pay | Admitting: Family Medicine

## 2023-11-28 DIAGNOSIS — F410 Panic disorder [episodic paroxysmal anxiety] without agoraphobia: Secondary | ICD-10-CM

## 2023-11-28 DIAGNOSIS — F411 Generalized anxiety disorder: Secondary | ICD-10-CM

## 2023-11-28 MED ORDER — LORAZEPAM 0.5 MG PO TABS: 0.5000 mg | ORAL_TABLET | Freq: Three times a day (TID) | ORAL | 0 refills | Status: AC | PRN

## 2023-11-28 NOTE — Progress Notes (Signed)
 PDMP reviewed, no red flags. Advised patient she needs to attend appointment with behavioral health in February to take over medication management of Ativan. Refill sent to pharmacy on file.

## 2023-11-28 NOTE — Telephone Encounter (Signed)
 Please see mychart message sent by pt and advise.

## 2023-12-01 ENCOUNTER — Ambulatory Visit (HOSPITAL_BASED_OUTPATIENT_CLINIC_OR_DEPARTMENT_OTHER): Payer: Medicare Other | Admitting: Family Medicine

## 2023-12-01 ENCOUNTER — Encounter (HOSPITAL_BASED_OUTPATIENT_CLINIC_OR_DEPARTMENT_OTHER): Payer: Self-pay

## 2023-12-13 NOTE — Progress Notes (Deleted)
Veronica Rangel, female    DOB: 1966-03-06   MRN: [id number]   Brief patient profile:  58 yowf active smoker carries dx of RA/fibromyalgia with tendency to sinus problems  surgery  age [age] and eventually improved referred to pulmonary clinic 04/28/2022 by [facility]  for sob         History of Present Illness  04/28/2022  Pulmonary/ 1st office eval/Veronica Rangel  Chief Complaint  Patient presents with   Consult    Pt states she has been having complaints of SOB. States she has also been told that she had COPD.  Dyspnea:  walked until  a few months prior to OV  /  20 minutes with [place] dog walking  Cough: none though aware of pnds watery Sleep: ok after ativan  2 pillows / bed is flat  SABA use: albuterol  for tightness  seems to help Rec   12/14/2023  f/u ov/Veronica Rangel re: ***   maint on ***  No chief complaint on file.   Dyspnea:  *** Cough: *** Sleeping: *** resp cc  SABA use: *** 02: ***  Lung cancer screening :  ***   No obvious day to day or daytime variability or assoc excess/ purulent sputum or mucus plugs or hemoptysis or cp or chest tightness, subjective wheeze or overt sinus or hb symptoms.    Also denies any obvious fluctuation of symptoms with weather or environmental changes or other aggravating or alleviating factors except as outlined above   No unusual exposure hx or h/o childhood pna/ asthma or knowledge of premature birth.  Current Allergies, Complete Past Medical History, Past Surgical History, Family History, and Social History were reviewed in [employer] record.  ROS  The following are not active complaints unless bolded Hoarseness, sore throat, dysphagia, dental problems, itching, sneezing,  nasal congestion or discharge of excess mucus or purulent secretions, ear ache,   fever, chills, sweats, unintended wt loss or wt gain, classically pleuritic or exertional cp,  orthopnea pnd or arm/hand swelling  or leg swelling, presyncope, palpitations, abdominal  pain, anorexia, nausea, vomiting, diarrhea  or change in bowel habits or change in bladder habits, change in stools or change in urine, dysuria, hematuria,  rash, arthralgias, visual complaints, headache, numbness, weakness or ataxia or problems with walking or coordination,  change in mood or  memory.        No outpatient medications have been marked as taking for the 12/14/23 encounter (Appointment) with Nyoka Cowden, MD.          Past Medical History:  Diagnosis Date   Cancer [facility])    renal   Fibromyalgia    RA (rheumatoid arthritis) (HCC)         Objective:    wts  12/14/2023        ***   10/12/23 206 lb 14.4 oz (93.8 kg)  09/27/23 207 lb 9.6 oz (94.2 kg)  09/09/23 160 lb (72.6 kg)      Vital signs reviewed  12/14/2023  - Note at rest 02 sats  ***% on ***   General appearance:    ***   thyroidectomy scar  Min barr***      I personally reviewed images and agree with radiology impression as follows:   Chest CTa 04/11/22   There is no evidence of pulmonary artery embolism. There is no evidence of thoracic aortic dissection.   COPD. There is prominence of interstitial markings in the periphery of both lungs suggesting  interstitial lung disease. No focal pulmonary infiltrates are seen. .    Assessment

## 2023-12-14 ENCOUNTER — Ambulatory Visit: Payer: Medicare Other | Admitting: Internal Medicine

## 2023-12-31 NOTE — Progress Notes (Deleted)
 Psychiatric Initial Adult Assessment   Patient Identification: Veronica Rangel MRN:  [id number] Date of Evaluation:  12/31/2023 Referral Source: *** Chief Complaint:  No chief complaint on file.  Visit Diagnosis: No diagnosis found.  History of Present Illness:   Veronica Rangel is a 58 y.o. year old female with a history of depression, anxiety, hypertension, diabetes, COPD, fibromyalgia, RA per chart, who is referred for depression, anxiety.   ? rheumatology  Venlafaxine 225 mg daily, lorazzepam 0.5 mg twice a day, 1 mg at night  Propranolol 10 mg three times a day      Associated Signs/Symptoms: Depression Symptoms:  {DEPRESSION SYMPTOMS:20000} (Hypo) Manic Symptoms:  {BHH MANIC SYMPTOMS:22872} Anxiety Symptoms:  {BHH ANXIETY SYMPTOMS:22873} Psychotic Symptoms:  {BHH PSYCHOTIC SYMPTOMS:22874} PTSD Symptoms: {BHH PTSD SYMPTOMS:22875}  Past Psychiatric History:  Outpatient:  Psychiatry admission:  Previous suicide attempt:  Past trials of medication:  History of violence:  History of head injury:   Previous Psychotropic Medications: {YES/NO:21197}  Substance Abuse History in the last 12 months:  {yes no:314532}  Consequences of Substance Abuse: {BHH CONSEQUENCES OF SUBSTANCE ABUSE:22880}  Past Medical History:  Past Medical History:  Diagnosis Date   Anxiety    Cancer (HCC)    renal   COPD (chronic obstructive pulmonary disease) (HCC)    Depression    Fibromyalgia    GERD (gastroesophageal reflux disease)    Hypertension    RA (rheumatoid arthritis) (HCC)    Shingles     Past Surgical History:  Procedure Laterality Date   ADRENALECTOMY Left    APPENDECTOMY     CESAREAN SECTION     CHOLECYSTECTOMY     NEPHRECTOMY RADICAL     TUBAL LIGATION      Family Psychiatric History: ***  Family History:  Family History  Problem Relation Age of Onset   Cancer Father        lung   Heart disease Maternal Grandfather    Diabetes Maternal Grandfather     Social  History:   Social History   Socioeconomic History   Marital status: Married    Spouse name: Not on file   Number of children: Not on file   Years of education: Not on file   Highest education level: Associate degree: occupational, Scientist, product/process development, or vocational program  Occupational History   Not on file  Tobacco Use   Smoking status: Some Days    Types: E-cigarettes   Smokeless tobacco: Never   Tobacco comments:    Reports she quit cigarette smoking about 04/11/2023    States she does participate in vaping   Vaping Use   Vaping status: Never Used  Substance and Sexual Activity   Alcohol use: Never   Drug use: Never   Sexual activity: Not Currently    Birth control/protection: None  Other Topics Concern   Not on file  Social History Narrative   Not on file   Social Drivers of Health   Financial Resource Strain: Low Risk  (09/27/2023)   Overall Financial Resource Strain (CARDIA)    Difficulty of Paying Living Expenses: Not hard at all  Food Insecurity: No Food Insecurity (09/27/2023)   Hunger Vital Sign    Worried About Radiation protection practitioner of Food in the Last Year: Never true    Ran Out of Food in the Last Year: Never true  Transportation Needs: No Transportation Needs (09/27/2023)   PRAPARE - Administrator, Civil Service (Medical): No    Lack of Transportation (Non-Medical): No  Physical Activity: Unknown (09/27/2023)   Exercise Vital Sign    Days of Exercise per Week: 0 days    Minutes of Exercise per Session: Not on file  Stress: Stress Concern Present (09/27/2023)   [employer] of Occupational Health - Occupational Stress Questionnaire    Feeling of Stress : Very much  Social Connections: Socially Isolated (09/27/2023)   Social Connection and Isolation Panel [NHANES]    Frequency of Communication with Friends and Family: Once a week    Frequency of Social Gatherings with Friends and Family: Never    Attends Religious Services: Never    Database administrator or  Organizations: No    Attends Engineer, structural: Not on file    Marital Status: Married    Additional Social History: ***  Allergies:   Allergies  Allergen Reactions   Codeine Anaphylaxis and Other (See Comments)   Amoxicillin-Pot Clavulanate Rash   Prednisone Anxiety    PO     Metabolic Disorder Labs: Lab Results  Component Value Date   HGBA1C 5.6 09/27/2023   No results found for: "PROLACTIN" Lab Results  Component Value Date   CHOL 223 (H) 11/08/2022   TRIG 239.0 (H) 11/08/2022   HDL 48.60 11/08/2022   CHOLHDL 5 11/08/2022   VLDL 47.8 (H) 11/08/2022   Lab Results  Component Value Date   TSH 3.696 05/05/2022    Therapeutic Level Labs: No results found for: "LITHIUM" No results found for: "CBMZ" No results found for: "VALPROATE"  Current Medications: Current Outpatient Medications  Medication Sig Dispense Refill   albuterol (VENTOLIN HFA) 108 (90 Base) MCG/ACT inhaler Inhale 2 puffs into the lungs every 6 (six) hours as needed for wheezing or shortness of breath. 8 g 3   budesonide-formoterol (SYMBICORT) 80-4.5 MCG/ACT inhaler Inhale 2 puffs into the lungs 2 (two) times daily. 1 each 3   docusate sodium (DULCOLAX PINK STOOL SOFTENER) 100 MG capsule Take 1 capsule (100 mg total) by mouth 2 (two) times daily. 60 capsule 2   esomeprazole (NEXIUM) 40 MG capsule Take 1 capsule (40 mg total) by mouth daily. 30 capsule 2   ipratropium-albuterol (DUONEB) 0.5-2.5 (3) MG/3ML SOLN Take 3 mLs by nebulization every 4 (four) hours as needed. 90 mL 5   methylPREDNISolone (MEDROL DOSEPAK) 4 MG TBPK tablet Follow instructions 21 each 0   propranolol (INDERAL) 10 MG tablet Take 1 tablet (10 mg total) by mouth 3 (three) times daily. 90 tablet 3   venlafaxine XR (EFFEXOR XR) 75 MG 24 hr capsule Take 1 capsule (75 mg total) by mouth daily with breakfast. Take with venlafaxine 150mg  to equal 225mg  daily 30 capsule 2   venlafaxine XR (EFFEXOR-XR) 150 MG 24 hr capsule Take 1  capsule (150 mg total) by mouth daily with breakfast. Take with venlafaxine 75mg  to equal venlafaxine 225mg  daily 30 capsule 2   No current facility-administered medications for this visit.    Musculoskeletal: Strength & Muscle Tone: within normal limits Gait & Station: normal Patient leans: N/A  Psychiatric Specialty Exam: Review of Systems  There were no vitals taken for this visit.There is no height or weight on file to calculate BMI.  General Appearance: {Appearance:22683}  Eye Contact:  {BHH EYE CONTACT:22684}  Speech:  Clear and Coherent  Volume:  Normal  Mood:  {BHH MOOD:22306}  Affect:  {Affect (PAA):22687}  Thought Process:  Coherent  Orientation:  Full (Time, Place, and Person)  Thought Content:  Logical  Suicidal Thoughts:  {ST/HT (PAA):22692}  Homicidal Thoughts:  {ST/HT (PAA):22692}  Memory:  Immediate;   Good  Judgement:  {Judgement (PAA):22694}  Insight:  {Insight (PAA):22695}  Psychomotor Activity:  Normal  Concentration:  Concentration: Good and Attention Span: Good  Recall:  Good  Fund of Knowledge:Good  Language: Good  Akathisia:  No  Handed:  Right  AIMS (if indicated):  not done  Assets:  Communication Skills Desire for Improvement  ADL's:  Intact  Cognition: WNL  Sleep:  {BHH GOOD/FAIR/POOR:22877}   Screenings: GAD-7    Flowsheet Row Office Visit from 09/27/2023 in [facility] & [facility] at [facility] Visit from 03/10/2023 in [facility] at [facility] Visit from 11/08/2022 in [facility] at [facility] Visit from 05/07/2022 in [facility]  Total GAD-7 Score 14 16 13 17       PHQ2-9    Flowsheet Row Office Visit from 09/27/2023 in [facility] Primary Care & Sports Medicine at [facility] Office Visit from 03/10/2023 in [facility] at [facility] Visit from 11/08/2022 in [facility] Visit from 05/07/2022 in [facility] Total Score 5 5 5 5   PHQ-9 Total Score 13 16 13 15       Flowsheet Row ED from 09/09/2023 in [facility] Emergency Department at [facility] ED from 08/14/2023 in [facility] Emergency Department at [facility] ED from 07/25/2023 in [facility] Emergency Department at [facility]  C-SSRS RISK CATEGORY No Risk No Risk No Risk       Assessment and Plan:   Plan   The patient demonstrates the following risk factors for suicide: Chronic risk factors for suicide include: {Chronic Risk Factors for WGNFAOZ:30865784}. Acute risk factors for suicide include: {Acute Risk Factors for ONGEXBM:84132440}. Protective factors for this patient include: {Protective Factors for Suicide NUUV:25366440}. Considering these factors, the overall suicide risk at this point appears to be {Desc; low/moderate/high:110033}. Patient {ACTION; IS/IS HKV:42595638} appropriate for outpatient follow up.   Collaboration of Care: {BH OP Collaboration of Care:21014065}  Patient/Guardian was advised Release of Information must be obtained prior to any record release in order to collaborate their care with an outside provider. Patient/Guardian was advised if they have not already done so to contact the registration department to sign all necessary forms in order for [place] to release information regarding their care.   Consent: Patient/Guardian gives verbal consent for treatment and assignment of benefits for services provided during this visit. Patient/Guardian expressed understanding and agreed to proceed.   Neysa Hotter, MD 2/8/20253:20 PM

## 2024-01-03 ENCOUNTER — Ambulatory Visit: Payer: Medicare Other | Admitting: Psychiatry

## 2024-01-04 ENCOUNTER — Other Ambulatory Visit: Payer: Self-pay

## 2024-01-04 ENCOUNTER — Emergency Department
Admission: EM | Admit: 2024-01-04 | Discharge: 2024-01-04 | Disposition: A | Discharge status: Home / Self Care | Payer: Medicare Other | Attending: Student in an Organized Health Care Education/Training Program | Admitting: Student in an Organized Health Care Education/Training Program

## 2024-01-04 ENCOUNTER — Emergency Department: Payer: Medicare Other

## 2024-01-04 DIAGNOSIS — R1310 Dysphagia, unspecified: Secondary | ICD-10-CM | POA: Insufficient documentation

## 2024-01-04 MED ORDER — ALUM & MAG HYDROXIDE-SIMETH 200-200-20 MG/5ML PO SUSP
30.0000 mL | Freq: Once | ORAL | Status: AC
  Administered 2024-01-04: 30 mL via ORAL
  Filled 2024-01-04: qty 30

## 2024-01-04 MED ORDER — LORAZEPAM 0.5 MG PO TABS: 0.5000 mg | ORAL_TABLET | Freq: Two times a day (BID) | ORAL | 0 refills | Status: AC

## 2024-01-04 MED ORDER — LORAZEPAM 1 MG PO TABS
0.5000 mg | ORAL_TABLET | Freq: Once | ORAL | Status: AC
  Administered 2024-01-04: 0.5 mg via ORAL
  Filled 2024-01-04: qty 1

## 2024-01-04 NOTE — ED Provider Notes (Signed)
[facility] Provider Note    Event Date/Time   First MD Initiated Contact with Patient 01/04/24 [phone]     (approximate)   History   unable to swallow   HPI  Veronica Rangel is a 58 y.o. female   with a longstanding history of anxiety presents to the ER for evaluation of intermittent trouble swallowing feeling like her nerves are acting up ever since she ran out of her Ativan on the sixth.  Says that she has follow-up with her doctor on Monday.  She denies any SI or HI.  No fevers or chills.  No shortness of breath.  No nausea or vomiting.      Physical Exam   Triage Vital Signs: ED Triage Vitals  Encounter Vitals Group     BP 01/04/24 0737 (!) 147/97     Systolic BP Percentile --      Diastolic BP Percentile --      Pulse Rate 01/04/24 0737 (!) 102     Resp 01/04/24 0737 19     Temp 01/04/24 0737 (!) 97.5 F (36.4 C)     Temp src --      SpO2 01/04/24 0737 93 %     Weight 01/04/24 0738 165 lb (74.8 kg)     Height 01/04/24 0738 5\' 3"  (1.6 m)     Head Circumference --      Peak Flow --      Pain Score 01/04/24 0738 7     Pain Loc --      Pain Education --      Exclude from Growth Chart --     Most recent vital signs: Vitals:   01/04/24 0737  BP: (!) 147/97  Pulse: (!) 102  Resp: 19  Temp: (!) 97.5 F (36.4 C)  SpO2: 93%     Constitutional: Alert  Eyes: Conjunctivae are normal.  Head: Atraumatic. Nose: No congestion/rhinnorhea. Mouth/Throat: Mucous membranes are moist.   Neck: Painless ROM.  Cardiovascular:   Good peripheral circulation. Respiratory: Normal respiratory effort.  No retractions.  Gastrointestinal: Soft and nontender.  Musculoskeletal:  no deformity Neurologic:  MAE spontaneously. No gross focal neurologic deficits are appreciated.  Skin:  Skin is warm, dry and intact. No rash noted. Psychiatric: Mood and affect are anxious. Speech and behavior are normal.    ED Results / Procedures / Treatments   Labs (all  labs ordered are listed, but only abnormal results are displayed) Labs Reviewed - No data to display   EKG     RADIOLOGY Please see ED Course for my review and interpretation.  I personally reviewed all radiographic images ordered to evaluate for the above acute complaints and reviewed radiology reports and findings.  These findings were personally discussed with the patient.  Please see medical record for radiology report.    PROCEDURES:  Critical Care performed: No  Procedures   MEDICATIONS ORDERED IN ED: Medications  LORazepam (ATIVAN) tablet 0.5 mg (0.5 mg Oral Given 01/04/24 0801)  alum & mag hydroxide-simeth (MAALOX/MYLANTA) 200-200-20 MG/5ML suspension 30 mL (30 mLs Oral Given 01/04/24 0801)     IMPRESSION / MDM / ASSESSMENT AND PLAN / ED COURSE  I reviewed the triage vital signs and the nursing notes.                              Differential diagnosis includes, but is not limited to, anxiety, COPD, ACS, CHF, pneumothorax, esophagitis,  food bolus impaction, mass, withdrawal  Patient presented to the ER for evaluation of symptoms as described above.  Clinically seems most consistent with exacerbation of underlying anxiety exacerbated by abrupt cessation of her Ativan.  Likely having some mild withdrawals but her neuroexam is reassuring.  Do not feel that blood work clinically indicated.  Chest x-ray my review and interpretation without evidence of pneumothorax or consolidation.  Patient given Ativan p.o.    Clinical Course as of 01/04/24 0901  Wed Jan 04, 2024  0858 Patient reassessed and feels significantly improved.  Given her presentation history and findings do suspect this is largely provide above anxiety and being off of her benzos.  She is denying any chest pain or pressure.  Is not consistent with ACS, dissection, PE.  Not consistent with Boerhaave's.  Not consistent with biliary pathology or acute intra-abdominal process.  She does appear stable and  appropriate for outpatient follow-up.  Will be given short course of her chronic benzo until she can follow-up with her PCP for refills.  We discussed signs and symptoms for which she should return to the ER.  Patient agreeable plan. [PR]    Clinical Course User Index [PR] Willy Eddy, MD     FINAL CLINICAL IMPRESSION(S) / ED DIAGNOSES   Final diagnoses:  Pain with swallowing     Rx / DC Orders   ED Discharge Orders          Ordered    LORazepam (ATIVAN) 0.5 MG tablet  2 times daily        01/04/24 0900             Note:  This document was prepared using Dragon voice recognition software and may include unintentional dictation errors.    Willy Eddy, MD 01/04/24 [phone]

## 2024-01-04 NOTE — ED Triage Notes (Signed)
Pt comes with c/o unable to swallow. Pt states this has been going on for a month on and off. Pt states she feels like something is in back of her throat and it hurts. Pt states she feels like she is having spasms. Pt speaking in clear complete sentences.  Pt also states she is out of her anxiety meds since Feb 6th and she is having withdrawals. Pt states she takes ativan.

## 2024-01-15 ENCOUNTER — Other Ambulatory Visit: Payer: Self-pay

## 2024-01-15 ENCOUNTER — Emergency Department: Payer: Medicare Other

## 2024-01-15 ENCOUNTER — Emergency Department
Admission: EM | Admit: 2024-01-15 | Discharge: 2024-01-15 | Disposition: A | Discharge status: Home / Self Care | Payer: Medicare Other | Attending: Emergency Medicine | Admitting: Emergency Medicine

## 2024-01-15 DIAGNOSIS — R7981 Abnormal blood-gas level: Secondary | ICD-10-CM | POA: Insufficient documentation

## 2024-01-15 DIAGNOSIS — R0602 Shortness of breath: Secondary | ICD-10-CM

## 2024-01-15 DIAGNOSIS — F19239 Other psychoactive substance dependence with withdrawal, unspecified: Secondary | ICD-10-CM | POA: Diagnosis not present

## 2024-01-15 DIAGNOSIS — J449 Chronic obstructive pulmonary disease, unspecified: Secondary | ICD-10-CM | POA: Insufficient documentation

## 2024-01-15 DIAGNOSIS — F419 Anxiety disorder, unspecified: Secondary | ICD-10-CM | POA: Insufficient documentation

## 2024-01-15 DIAGNOSIS — F13930 Sedative, hypnotic or anxiolytic use, unspecified with withdrawal, uncomplicated: Secondary | ICD-10-CM

## 2024-01-15 LAB — CBC
HCT: 41 % (ref 36.0–46.0)
Hemoglobin: 14 g/dL (ref 12.0–15.0)
MCH: 28.2 pg (ref 26.0–34.0)
MCHC: 34.1 g/dL (ref 30.0–36.0)
MCV: 82.7 fL (ref 80.0–100.0)
Platelets: 334 10*3/uL (ref 150–400)
RBC: 4.96 MIL/uL (ref 3.87–5.11)
RDW: 14.1 % (ref 11.5–15.5)
WBC: 8.8 10*3/uL (ref 4.0–10.5)
nRBC: 0 % (ref 0.0–0.2)

## 2024-01-15 LAB — BASIC METABOLIC PANEL
Anion gap: 14 (ref 5–15)
BUN: 13 mg/dL (ref 6–20)
CO2: 18 mmol/L — ABNORMAL LOW (ref 22–32)
Calcium: 9.2 mg/dL (ref 8.9–10.3)
Chloride: 103 mmol/L (ref 98–111)
Creatinine, Ser: 0.71 mg/dL (ref 0.44–1.00)
GFR, Estimated: >60 mL/min (ref >60)
Glucose, Bld: 116 mg/dL — ABNORMAL HIGH (ref 70–99)
Potassium: 4.5 mmol/L (ref 3.5–5.1)
Sodium: 135 mmol/L (ref 135–145)

## 2024-01-15 LAB — D-DIMER, QUANTITATIVE: D-Dimer, Quant: 1.15 ug{FEU}/mL — ABNORMAL HIGH (ref 0.00–0.50)

## 2024-01-15 LAB — HEPATIC FUNCTION PANEL
ALT: 21 U/L (ref 0–44)
AST: 21 U/L (ref 15–41)
Albumin: 3.8 g/dL (ref 3.5–5.0)
Alkaline Phosphatase: 77 U/L (ref 38–126)
Bilirubin, Direct: <0.1 mg/dL (ref 0.0–0.2)
Total Bilirubin: 0.5 mg/dL (ref 0.0–1.2)
Total Protein: 7.6 g/dL (ref 6.5–8.1)

## 2024-01-15 LAB — TROPONIN I (HIGH SENSITIVITY): Troponin I (High Sensitivity): 3 ng/L (ref <18)

## 2024-01-15 LAB — LIPASE, BLOOD: Lipase: 24 U/L (ref 11–51)

## 2024-01-15 MED ORDER — LORAZEPAM 2 MG/ML IJ SOLN
1.0000 mg | Freq: Once | INTRAMUSCULAR | Status: AC
  Administered 2024-01-15: 1 mg via INTRAVENOUS
  Filled 2024-01-15: qty 1

## 2024-01-15 MED ORDER — LORAZEPAM 0.5 MG PO TABS: 0.5000 mg | ORAL_TABLET | Freq: Three times a day (TID) | ORAL | 0 refills | Status: AC | PRN

## 2024-01-15 MED ORDER — IOHEXOL 350 MG/ML SOLN
75.0000 mL | Freq: Once | INTRAVENOUS | Status: AC | PRN
  Administered 2024-01-15: 75 mL via INTRAVENOUS

## 2024-01-15 MED ORDER — KETOROLAC TROMETHAMINE 15 MG/ML IJ SOLN
15.0000 mg | Freq: Once | INTRAMUSCULAR | Status: AC
Start: 2024-01-15 — End: 2024-01-15
  Administered 2024-01-15: 15 mg via INTRAVENOUS
  Filled 2024-01-15: qty 1

## 2024-01-15 NOTE — ED Triage Notes (Signed)
 Pt comes with c/o sob that has been ongoing. Pt states she has copd and just can't breath. Pt also states lots of pain from spasms and has hx of fibromyalgia.   Pt states she is out of her ativan also

## 2024-01-15 NOTE — ED Notes (Addendum)
 Pt given cup of water.  Dr Marisa Severin at bedside

## 2024-01-15 NOTE — Discharge Instructions (Addendum)
 It is dangerous to stop benzo abruptly.  It is important that you get a primary care doctor that can prescribe these medications for you in the future as it is inappropriate to be getting them from the emergency room as this is a controlled substance.  You should follow-up with a primary care doctor or psychiatrist as soon as possible giving the number for [facility] to follow-up with if he cannot get in anywhere else.

## 2024-01-15 NOTE — ED Provider Notes (Signed)
 [facility] Provider Note    Event Date/Time   First MD Initiated Contact with Patient 01/15/24 1334     (approximate)   History   Shortness of Breath   HPI  Veronica Rangel is a 58 y.o. female  with history of COPD, anxiety who comes in with multiple symptoms.  She reports SOB. SOB has been for one week. IT is getting worse after running out ativan 4 days ago.  No fevers, mild cough.   She has RUQ pain radiating to the abdomen, this is more of a chronic issue.  She reports having fibromyalgia and states that she had fallen and had some rib fractures from a long time ago and has had chronic pain and spasming of her upper abdomen since then.  She is already had her gallbladder removed.  Does report using edibles due to anxiety. She was prescribed 14 pills on 2/10.  Patient does report that she take 0.5 mg of Ativan 3 times daily typically    Physical Exam   Triage Vital Signs: ED Triage Vitals  Encounter Vitals Group     BP 01/15/24 1301 (!) 149/99     Systolic BP Percentile --      Diastolic BP Percentile --      Pulse Rate 01/15/24 1301 84     Resp 01/15/24 1301 17     Temp 01/15/24 1301 98 F (36.7 C)     Temp src --      SpO2 01/15/24 1301 97 %     Weight 01/15/24 1300 165 lb (74.8 kg)     Height 01/15/24 1300 5\' 3"  (1.6 m)     Head Circumference --      Peak Flow --      Pain Score 01/15/24 1259 8     Pain Loc --      Pain Education --      Exclude from Growth Chart --     Most recent vital signs: Vitals:   01/15/24 1301  BP: (!) 149/99  Pulse: 84  Resp: 17  Temp: 98 F (36.7 C)  SpO2: 97%     General: Awake, no distress.  CV:  Good peripheral perfusion.  Resp:  Normal effort.  Abd:  No distention.  Reported tenderness on her right upper abdomen underneath her breast.  No redness noted.  No rebound, no guarding.  No lower abdominal tenderness Other: Patient denies any SI but does appear anxious.    ED Results / Procedures /  Treatments   Labs (all labs ordered are listed, but only abnormal results are displayed) Labs Reviewed  BASIC METABOLIC PANEL - Abnormal; Notable for the following components:      Result Value   CO2 18 (*)    Glucose, Bld 116 (*)    All other components within normal limits  RESP PANEL BY RT-PCR (RSV, FLU A&B, COVID)  RVPGX2  CBC  TROPONIN I (HIGH SENSITIVITY)     EKG  My interpretation of EKG:  Normal sinus rate of 80, no st elevation, no twi, normal intervals.   RADIOLOGY I have reviewed the xray personally and interpreted and no PNA    IMPRESSION: 1. No active cardiopulmonary disease. 2. Mild hyperinflation.  PROCEDURES:  Critical Care performed: No  .1-3 Lead EKG Interpretation  Performed by: Concha Se, MD Authorized by: Concha Se, MD     Interpretation: normal     ECG rate:  80   ECG rate assessment:  normal     Rhythm: sinus rhythm     Ectopy: none     Conduction: normal      MEDICATIONS ORDERED IN ED: Medications - No data to display   IMPRESSION / MDM / ASSESSMENT AND PLAN / ED COURSE  I reviewed the triage vital signs and the nursing notes.   Patient's presentation is most consistent with acute presentation with potential threat to life or bodily function.   Patient comes in with multiple symptoms most concerning his shortness of breath and upper abdominal pain.  I reviewed and she has had CT imaging of her abdomen previously back in 2023 for right upper abdominal pain that was negative she is already had a gallbladder removed.  Will get labs to make sure no signs of choledocholithiasis.  Will get D-dimer to evaluate for PE.  Do not hear any wheezing to suggest COPD.  I suspect a lot of this is related to Ativan withdrawal.  I will give 1 mg of IV Ativan, IV Toradol to help with patient's symptoms.  Troponin is negative.  BMP shows low bicarb most likely for some hyper ventilation.  CBC is reassuring x-ray was negative  Patient will be  handed off to oncoming team pending medications, D-dimer and repeat evaluation.  Discussed with patient that stopping Ativan or other benzos abruptly can be very dangerous. She reports her doctor has left the practice making it hard to get her prescription. I discussed that getting meds from ER is not appropriate but given the severity of going without we can do a short course to help her find a psychiatry team or pcp to help prescribe long term. She got ativan on 2/12 but already came back today for running out.   The patient is on the cardiac monitor to evaluate for evidence of arrhythmia and/or significant heart rate changes.   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   Concha Se, MD 01/15/24 [phone]

## 2024-01-15 NOTE — ED Provider Notes (Signed)
-----------------------------------------   8:38 PM on 01/15/2024 -----------------------------------------  I took over care of this patient from Dr. Fuller Plan.  D-dimer was elevated so we obtained a CT angio of the chest which is negative for PE.  IMPRESSION:  1. No evidence for pulmonary embolism.  2. Stable peripheral interstitial and ground-glass opacities in the  bilateral upper lobes. Findings may be related to chronic  interstitial lung disease.  3. Stable 3 mm nodule in the right lower lobe. No follow-up needed  if patient is low-risk.This recommendation follows the consensus  statement: Guidelines for Management of Incidental Pulmonary Nodules  Detected on CT Images: From the Fleischner Society 2017; Radiology  2017; 284:228-243.    On reassessment, the patient is comfortable and her symptoms have resolved.  She is stable for discharge home at this time.  I counseled her on the results of the workup and plan of care.  I gave strict return precautions and she expressed understanding.   Dionne Bucy, MD 01/15/24 2039

## 2024-01-15 NOTE — ED Notes (Addendum)
 Pt refusing covid/flu swab, stating she doesn't leave her house.

## 2024-01-23 ENCOUNTER — Emergency Department
Admission: EM | Admit: 2024-01-23 | Discharge: 2024-01-23 | Disposition: A | Discharge status: Home / Self Care | Attending: Emergency Medicine | Admitting: Emergency Medicine

## 2024-01-23 ENCOUNTER — Emergency Department

## 2024-01-23 ENCOUNTER — Other Ambulatory Visit: Payer: Self-pay

## 2024-01-23 DIAGNOSIS — R0789 Other chest pain: Secondary | ICD-10-CM | POA: Insufficient documentation

## 2024-01-23 DIAGNOSIS — F419 Anxiety disorder, unspecified: Secondary | ICD-10-CM | POA: Diagnosis not present

## 2024-01-23 DIAGNOSIS — J449 Chronic obstructive pulmonary disease, unspecified: Secondary | ICD-10-CM | POA: Insufficient documentation

## 2024-01-23 DIAGNOSIS — R079 Chest pain, unspecified: Secondary | ICD-10-CM

## 2024-01-23 LAB — BASIC METABOLIC PANEL
Anion gap: 9 (ref 5–15)
BUN: 13 mg/dL (ref 6–20)
CO2: 22 mmol/L (ref 22–32)
Calcium: 9.4 mg/dL (ref 8.9–10.3)
Chloride: 106 mmol/L (ref 98–111)
Creatinine, Ser: 0.73 mg/dL (ref 0.44–1.00)
GFR, Estimated: >60 mL/min (ref >60)
Glucose, Bld: 126 mg/dL — ABNORMAL HIGH (ref 70–99)
Potassium: 4.5 mmol/L (ref 3.5–5.1)
Sodium: 137 mmol/L (ref 135–145)

## 2024-01-23 LAB — CBC
HCT: 40.1 % (ref 36.0–46.0)
Hemoglobin: 13.8 g/dL (ref 12.0–15.0)
MCH: 28.6 pg (ref 26.0–34.0)
MCHC: 34.4 g/dL (ref 30.0–36.0)
MCV: 83 fL (ref 80.0–100.0)
Platelets: 339 10*3/uL (ref 150–400)
RBC: 4.83 MIL/uL (ref 3.87–5.11)
RDW: 14.2 % (ref 11.5–15.5)
WBC: 8.4 10*3/uL (ref 4.0–10.5)
nRBC: 0 % (ref 0.0–0.2)

## 2024-01-23 LAB — TROPONIN I (HIGH SENSITIVITY): Troponin I (High Sensitivity): 3 ng/L (ref <18)

## 2024-01-23 MED ORDER — LORAZEPAM 1 MG PO TABS
1.0000 mg | ORAL_TABLET | Freq: Once | ORAL | Status: AC
  Administered 2024-01-23: 1 mg via ORAL
  Filled 2024-01-23: qty 1

## 2024-01-23 NOTE — ED Provider Notes (Signed)
 [facility] Provider Note   Event Date/Time   First MD Initiated Contact with Patient 01/23/24 1045     (approximate) History  Chest Pain  HPI Veronica Rangel is a 58 y.o. female with stated past medical history of anxiety, COPD, fibromyalgia, and depression who presents complaining of worsening chest pain and dyspnea on exertion due to increased anxiety from being out of her Ativan for the last 2 days.  Patient states that she has no other complaints other than feeling extremely anxious from being out of her Ativan and requests Ativan prescription prior to follow-up with her primary care physician. ROS: Patient currently denies any vision changes, tinnitus, difficulty speaking, facial droop, sore throat, abdominal pain, nausea/vomiting/diarrhea, dysuria, or weakness/numbness/paresthesias in any extremity   Physical Exam  Triage Vital Signs: ED Triage Vitals  Encounter Vitals Group     BP 01/23/24 1000 117/74     Systolic BP Percentile --      Diastolic BP Percentile --      Pulse Rate 01/23/24 1000 (!) 106     Resp 01/23/24 1000 20     Temp 01/23/24 1000 98.1 F (36.7 C)     Temp Source 01/23/24 1000 Oral     SpO2 01/23/24 1000 100 %     Weight 01/23/24 1030 164 lb 14.5 oz (74.8 kg)     Height 01/23/24 1001 5\' 4"  (1.626 m)     Head Circumference --      Peak Flow --      Pain Score 01/23/24 1001 8     Pain Loc --      Pain Education --      Exclude from Growth Chart --    Most recent vital signs: Vitals:   01/23/24 1000  BP: 117/74  Pulse: (!) 106  Resp: 20  Temp: 98.1 F (36.7 C)  SpO2: 100%   General: Awake, oriented x4. CV:  Good peripheral perfusion.  Resp:  Normal effort.  Abd:  No distention.  Other:  Middle-aged obese Caucasian female resting comfortably in no acute distress ED Results / Procedures / Treatments  Labs (all labs ordered are listed, but only abnormal results are displayed) Labs Reviewed  BASIC METABOLIC PANEL - Abnormal;  Notable for the following components:      Result Value   Glucose, Bld 126 (*)    All other components within normal limits  CBC  TROPONIN I (HIGH SENSITIVITY)   EKG ED ECG REPORT I, Merwyn Katos, the attending physician, personally viewed and interpreted this ECG. Date: 01/23/2024 EKG Time: 1004 Rate: 77 Rhythm: normal sinus rhythm QRS Axis: normal Intervals: normal ST/T Wave abnormalities: normal Narrative Interpretation: no evidence of acute ischemia RADIOLOGY ED MD interpretation: 2 view chest x-ray interpreted by me shows no evidence of acute abnormalities including no pneumonia, pneumothorax, or widened mediastinum -Agree with radiology assessment Official radiology report(s): No results found. PROCEDURES: Critical Care performed: No Procedures MEDICATIONS ORDERED IN ED: Medications  LORazepam (ATIVAN) tablet 1 mg (has no administration in time range)   IMPRESSION / MDM / ASSESSMENT AND PLAN / ED COURSE  I reviewed the triage vital signs and the nursing notes.                             The patient is on the cardiac monitor to evaluate for evidence of arrhythmia and/or significant heart rate changes. Patient's presentation is most consistent with acute presentation  with potential threat to life or bodily function. This patient presents with symptoms consistent with acute anxiety reaction / panic attack. Low suspicion for acute cardiopulmonary process including ACS, PE, or thoracic aortic dissection. Denies any ingestions or any other medical complaints. No evidence of alcohol withdrawal symptoms. Presentation not consistent with overt toxidrome, ingestion given history & physical. Presentation not consistent with organic or medical emergency at this time. No acute indication for psychiatric consultation (without SI/HI, AH/VH). Cautious return precautions discussed with full understanding.  Plan: PCP follow up tomorrow Dispo: Discharge   FINAL CLINICAL IMPRESSION(S) /  ED DIAGNOSES   Final diagnoses:  Chest pain, unspecified type  Anxiety   Rx / DC Orders   ED Discharge Orders     None      Note:  This document was prepared using Dragon voice recognition software and may include unintentional dictation errors.   Merwyn Katos, MD 01/23/24 1058

## 2024-01-23 NOTE — ED Notes (Signed)
 See triage note  Presents with some SOB and chest discomfort  States she does have an appt tomorrow for medication refill  But states she had a "spell" this am

## 2024-01-23 NOTE — ED Triage Notes (Signed)
 Patient states chest pain and shortness of breath; states she is out of Ativan and this happens when she doesn't have her meds. Has an appointment tomorrow to get a refill.

## 2024-01-24 ENCOUNTER — Ambulatory Visit (INDEPENDENT_AMBULATORY_CARE_PROVIDER_SITE_OTHER): Admitting: Family Medicine

## 2024-01-24 VITALS — BP 130/84 | HR 101 | Temp 97.5°F | Ht 64.0 in | Wt 201.4 lb

## 2024-01-24 DIAGNOSIS — F411 Generalized anxiety disorder: Secondary | ICD-10-CM

## 2024-01-24 DIAGNOSIS — M539 Dorsopathy, unspecified: Secondary | ICD-10-CM | POA: Insufficient documentation

## 2024-01-24 DIAGNOSIS — M47816 Spondylosis without myelopathy or radiculopathy, lumbar region: Secondary | ICD-10-CM | POA: Insufficient documentation

## 2024-01-24 DIAGNOSIS — F410 Panic disorder [episodic paroxysmal anxiety] without agoraphobia: Secondary | ICD-10-CM | POA: Insufficient documentation

## 2024-01-24 DIAGNOSIS — R209 Unspecified disturbances of skin sensation: Secondary | ICD-10-CM | POA: Insufficient documentation

## 2024-01-24 DIAGNOSIS — M545 Low back pain, unspecified: Secondary | ICD-10-CM | POA: Insufficient documentation

## 2024-01-24 DIAGNOSIS — M76899 Other specified enthesopathies of unspecified lower limb, excluding foot: Secondary | ICD-10-CM | POA: Insufficient documentation

## 2024-01-24 DIAGNOSIS — J449 Chronic obstructive pulmonary disease, unspecified: Secondary | ICD-10-CM | POA: Diagnosis not present

## 2024-01-24 DIAGNOSIS — K5903 Drug induced constipation: Secondary | ICD-10-CM | POA: Insufficient documentation

## 2024-01-24 DIAGNOSIS — M797 Fibromyalgia: Secondary | ICD-10-CM | POA: Insufficient documentation

## 2024-01-24 DIAGNOSIS — R279 Unspecified lack of coordination: Secondary | ICD-10-CM | POA: Insufficient documentation

## 2024-01-24 DIAGNOSIS — M6281 Muscle weakness (generalized): Secondary | ICD-10-CM | POA: Insufficient documentation

## 2024-01-24 DIAGNOSIS — M546 Pain in thoracic spine: Secondary | ICD-10-CM | POA: Insufficient documentation

## 2024-01-24 MED ORDER — PROPRANOLOL HCL 10 MG PO TABS: 10.0000 mg | ORAL_TABLET | Freq: Two times a day (BID) | ORAL | 2 refills | Status: DC

## 2024-01-24 MED ORDER — LORAZEPAM 0.5 MG PO TABS: ORAL_TABLET | ORAL | 0 refills | Status: DC

## 2024-01-24 NOTE — Patient Instructions (Signed)

## 2024-01-24 NOTE — Progress Notes (Signed)
 Established Patient Office Visit  Subjective  Patient ID: Veronica Rangel, female    DOB: 1965/12/23  Age: 58 y.o. MRN: [id number]  Chief Complaint  Patient presents with   Anxiety    Pt. Stats of Feeling nervous. Pt. Stats when feeling nervous her chest feels tight. Pt. Denies SOB.   ANXIETY: Veronica Rangel is a 58 year old female patient who presents for the medical management of anxiety. Reports shortness of breath. Does have a history of uncontrolled COPD and states she is no longer smoking.  Current medication regimen: Effexor 225mg  daily- unsure if it is helping. Has been on various meds in the past for mood.  Propranolol 10mg  TID- tolerating it well. Noticed her HR was in the 60s, denies lightheadedness, fatigue, dizziness with standing, chest pain, palpitations.  Well controlled: no Denies SI/HI.  Missed previously scheduled psychiatry appt- has upcoming appt 4/9 with [facility] (?)  Reports that she has multiple panic attacks during the day- she has been on Ativan for a couple of years and still feels like her anxiety is not controlled.  Reports that she feels like her "insides continue to feel like they're being squeezed."   COPD & ILD: Albuterol prn & duoneb PRN  Symbicort 2 puffs BID- stopped using this since she reports nausea with it  Scheduled appt with pulmonology on 3/19     01/24/2024    1:27 PM 09/27/2023    4:17 PM 03/10/2023    1:48 PM 11/08/2022    2:59 PM  GAD 7 : Generalized Anxiety Score  Nervous, Anxious, on Edge 3 3 3 2   Control/stop worrying 3 2 3 2   Worry too much - different things 3 2 3 2   Trouble relaxing 2 3 3 2   Restless 2 0 0 1  Easily annoyed or irritable 1 1 2 2   Afraid - awful might happen 2 3 2 2   Total GAD 7 Score 16 14 16 13   Anxiety Difficulty Not difficult at all Somewhat difficult  Very difficult      01/24/2024    1:25 PM 09/27/2023    4:16 PM 03/10/2023    1:48 PM  PHQ9 SCORE ONLY  PHQ-9 Total Score 14 13 16    ROS:  see HPI    Objective:    BP 130/84 (BP Location: Right Arm, Patient Position: Sitting, Cuff Size: Large)   Pulse (!) 101   Temp (!) 97.5 F (36.4 C) (Oral)   Ht 5\' 4"  (1.626 m)   Wt 201 lb 6.4 oz (91.4 kg)   SpO2 96%   BMI 34.57 kg/m  BP Readings from Last 3 Encounters:  01/24/24 130/84  01/23/24 117/74  01/15/24 122/85    Physical Exam Vitals reviewed.  Constitutional:      Appearance: Normal appearance.  Cardiovascular:     Rate and Rhythm: Regular rhythm. Tachycardia present.     Pulses: Normal pulses.     Heart sounds: Normal heart sounds.  Pulmonary:     Effort: Pulmonary effort is normal.     Breath sounds: Normal breath sounds.  Neurological:     Mental Status: She is alert.  Psychiatric:        Mood and Affect: Mood normal. Affect is tearful.        Speech: Speech is rapid and pressured.        Behavior: Behavior normal.        Thought Content: Thought content normal.     Assessment & Plan:   1.  Panic anxiety syndrome (Primary) Patient is a pleasant 58 year old female patient who presents today for anxiety follow-up.  She has been seen in the ED multiple times for her anxiety.  Most recently was seen on 01/15/2024 for anxiety and 01/23/2024 for non-cardiac chest pain. GAD7 completed with score of 16. Denies SI/HI. PDMP reviewed, no red flags present. Discussed with patient importance of following up with psychiatry due being on multiple mood medications, uncontrolled anxiety, and requiring Ativan TID. She reports she has an appointment with [employer] on the 9th- unable to tell me March or April. Will request records.   - LORazepam (ATIVAN) 0.5 MG tablet; Take 1 in the morning, 1 at lunch, and 2 at bedtime as needed for anxiety  Dispense: 120 tablet; Refill: 0 - propranolol (INDERAL) 10 MG tablet; Take 1 tablet (10 mg total) by mouth 2 (two) times daily.  Dispense: 60 tablet; Refill: 2  2. Generalized anxiety disorder See #1 Will change 10mg   propranolol rx from TID to BID due to reports of her HR being in the 60s. Denies lightheadedness, palpitations, & dizziness. Advised patient to monitor heart rate at home intermittently.   - LORazepam (ATIVAN) 0.5 MG tablet; Take 1 in the morning, 1 at lunch, and 2 at bedtime as needed for anxiety  Dispense: 120 tablet; Refill: 0 - propranolol (INDERAL) 10 MG tablet; Take 1 tablet (10 mg total) by mouth 2 (two) times daily.  Dispense: 60 tablet; Refill: 2  3. Chronic obstructive pulmonary disease, unspecified COPD type [facility]) Has appointment with pulmonology on 02/08/2024.    Return in about 6 weeks (around 03/06/2024) for Mood f/u.    Alyson Reedy, FNP

## 2024-01-26 ENCOUNTER — Encounter: Payer: Self-pay | Admitting: Family Medicine

## 2024-02-02 ENCOUNTER — Other Ambulatory Visit (HOSPITAL_BASED_OUTPATIENT_CLINIC_OR_DEPARTMENT_OTHER): Payer: Self-pay | Admitting: Family Medicine

## 2024-02-02 DIAGNOSIS — F411 Generalized anxiety disorder: Secondary | ICD-10-CM

## 2024-02-02 DIAGNOSIS — F410 Panic disorder [episodic paroxysmal anxiety] without agoraphobia: Secondary | ICD-10-CM

## 2024-02-03 ENCOUNTER — Other Ambulatory Visit: Payer: Self-pay | Admitting: Family Medicine

## 2024-02-03 DIAGNOSIS — M62838 Other muscle spasm: Secondary | ICD-10-CM

## 2024-02-03 MED ORDER — BACLOFEN 5 MG PO TABS: 5.0000 mg | ORAL_TABLET | Freq: Three times a day (TID) | ORAL | 0 refills | Status: DC | PRN

## 2024-02-03 NOTE — Telephone Encounter (Signed)
 Called pt LVM that the medication was called in

## 2024-02-03 NOTE — Telephone Encounter (Signed)
 Copied from CRM [id number]. Topic: General - Other >> Feb 02, 2024 11:32 AM Emylou G wrote: Reason for CRM: Patient called checking status of baclofen that was the alternative suggested - was it sent to be filled? Patient number: [id number]

## 2024-02-07 NOTE — Progress Notes (Unsigned)
 Veronica Rangel, female    DOB: Apr 08, 1966   MRN: [id number]   Brief patient profile:  58 yowf active smoker carries dx of RA/fibromyalgia with tendency to sinus problems yowf active smoker carries dx of RA/fibromyalgia with tendency to sinus problems  surgery  age 58 or 58 and eventually improved referred to pulmonary clinic 04/28/2022 by Veronica Rangel and eventually improved referred to pulmonary clinic 04/28/2022 by Veronica Rangel  for sob         History of Present Illness  04/28/2022  Pulmonary/ 1st office eval/Veronica Rangel  Chief Complaint  Patient presents with   Consult    Pt states she has been having complaints of SOB. States she has also been told that she had COPD.  Dyspnea:  walked until  a few months prior to OV  /  20 minutes with hills dog walking  Cough: none though aware of pnds watery Sleep: ok after ativan  2 pillows / bed is flat  SABA use: albuterol  for tightness  seems to help Rec The key is to stop smoking   For drainage / throat tickle try take CHLORPHENIRAMINE  4 mg Pantoprazole (protonix) 40 mg   Take  30-60 min before first meal of the day and Pepcid (famotidine)  20 mg after supper until return to office GERD diet reviewed, bed blocks rec  To get the most out of exercise, you need to be continuously aware that you are short of breath, but never out of breath,build up to 30 minutes daily.      02/08/2024  f/u ov/Veronica Rangel re: sob  maint on duoneb   No chief complaint on file. Dyspnea:  no longer walking  Cough: none  Sleeping: flat bed  2 pillows s  resp cc p ativan  SABA use: duoneb  02: none      No obvious day to day or daytime variability or assoc excess/ purulent sputum or mucus plugs or hemoptysis or cp or chest tightness, subjective wheeze or overt sinus or hb symptoms.    Also denies any obvious fluctuation of symptoms with weather or environmental changes or other aggravating or alleviating factors except as outlined above   No unusual exposure hx or h/o childhood pna/ asthma or knowledge of premature birth.  Current Allergies, Complete Past Medical History, Past Surgical History, Family History, and Social History were  reviewed in [employer] record.  ROS  The following are not active complaints unless bolded Hoarseness, sore throat, dysphagia, dental problems, itching, sneezing,  nasal congestion or discharge of excess mucus or purulent secretions, ear ache,   fever, chills, sweats, unintended wt loss or wt gain, classically pleuritic or exertional cp,  orthopnea pnd or arm/hand swelling  or leg swelling, presyncope, palpitations, abdominal pain, anorexia, nausea, vomiting, diarrhea  or change in bowel habits or change in bladder habits, change in stools or change in urine, dysuria, hematuria,  rash, arthralgias, visual complaints, headache, numbness, weakness or ataxia or problems with walking or coordination,  change in mood or  memory.        Current Meds  Medication Sig   albuterol (VENTOLIN HFA) 108 (90 Base) MCG/ACT inhaler Inhale 2 puffs into the lungs every 6 (six) hours as needed for wheezing or shortness of breath.   Baclofen 5 MG TABS Take 1 tablet (5 mg total) by mouth 3 (three) times daily as needed.   ipratropium-albuterol (DUONEB) 0.5-2.5 (3) MG/3ML SOLN Take 3 mLs by nebulization every 4 (four) hours as needed.   LORazepam (ATIVAN) 0.5 MG tablet Take 1 in the morning, 1 at lunch, and 2 at bedtime as needed for  anxiety   propranolol (INDERAL) 10 MG tablet Take 1 tablet (10 mg total) by mouth 2 (two) times daily.   venlafaxine XR (EFFEXOR XR) 75 MG 24 hr capsule Take 1 capsule (75 mg total) by mouth daily with breakfast. Take with venlafaxine 150mg  to equal 225mg  daily   venlafaxine XR (EFFEXOR-XR) 150 MG 24 hr capsule Take 1 capsule (150 mg total) by mouth daily with breakfast. Take with venlafaxine 75mg  to equal venlafaxine 225mg  daily            Past Medical History:  Diagnosis Date   Cancer (HCC)    renal   Fibromyalgia    RA (rheumatoid arthritis) (HCC)        Objective:       Wt Readings from Last 3 Encounters:  02/08/24 204 lb 9.6 oz (92.8 kg)   01/24/24 201 lb 6.4 oz (91.4 kg)  01/23/24 164 lb 14.5 oz (74.8 kg)      Vital signs reviewed  02/08/2024  - Note at rest 02 sats  98% on RA    General appearance:    qmb wf can't use hands to trigger hfa      HEENT : Oropharynx  clear   Nasal turbinates nl     NECK :  without  apparent JVD/ palpable Nodes/TM   thyroidectomy scar   LUNGS: no acc muscle use,  Min barrel  contour chest wall with bilateral  slightly decreased bs s audible wheeze and  without cough on insp or exp maneuvers and min  Hyperresonant  to  percussion bilaterally    CV:  RRR  no s3 or murmur or increase in P2, and no edema   ABD:  soft and nontender with pos end  insp Hoover's  in the supine position.  No bruits or organomegaly appreciated   MS:  Nl gait/ ext warm with severe hand HA deformities Or obvious joint restrictions  calf tenderness, cyanosis or clubbing     SKIN: warm and dry without lesions    NEURO:  alert, approp, nl sensorium with  no motor or cerebellar deficits apparent.           I personally reviewed images and agree with radiology impression as follows:   Chest CTa 04/11/22   There is no evidence of pulmonary artery embolism. There is no evidence of thoracic aortic dissection. COPD. There is prominence of interstitial markings in the periphery of both lungs suggesting interstitial lung disease. No focal pulmonary infiltrates are seen. .    Assessment

## 2024-02-08 ENCOUNTER — Ambulatory Visit: Payer: Medicare Other | Admitting: Internal Medicine

## 2024-02-08 ENCOUNTER — Encounter: Payer: Self-pay | Admitting: Internal Medicine

## 2024-02-08 VITALS — BP 108/68 | HR 116 | Temp 97.9°F | Ht 64.0 in | Wt 204.6 lb

## 2024-02-08 DIAGNOSIS — J449 Chronic obstructive pulmonary disease, unspecified: Secondary | ICD-10-CM | POA: Diagnosis not present

## 2024-02-08 DIAGNOSIS — M0510 Rheumatoid lung disease with rheumatoid arthritis of unspecified site: Secondary | ICD-10-CM

## 2024-02-08 DIAGNOSIS — F17210 Nicotine dependence, cigarettes, uncomplicated: Secondary | ICD-10-CM

## 2024-02-08 NOTE — Assessment & Plan Note (Signed)
 Active smoker with severe chronic anxiety disorder/panic  - onset doe early spring 2023 - Spirometry 04/28/2022  FEV1 2.2  (85%)  Ratio 0.78 p no rx and f/v nl  - 04/28/2022   Walked on RA   x  3   lap(s) =  approx 750  ft  @ avg pace, stopped due to end of study with lowest 02 sats 95%   - 02/08/2024  After extensive coaching inhaler device,  effectiveness =    hfa 0 (RA) so changed to dpi > 90% with trrlegy 100 / samples given   If trelegy really helps I would replace with BREO 100 assuming these must be an inflammatory component to airway dz (ie RAD

## 2024-02-08 NOTE — Patient Instructions (Signed)
 Be sure to keep follow your rheumatologist  - RA is affecting lungs but should not progress if you keep it under control   Make sure you check your oxygen saturation at your highest level of activity(NOT after you stop)  to be sure it stays over 90% and keep track of it at least once a week, more often if breathing getting worse, and let me know if losing ground. (Collect the dots to connect the dots approach)     Ok to try trelegy 100 one click each am x 2 weeks to see helps    Pulmonary follow up is as needed

## 2024-02-08 NOTE — Assessment & Plan Note (Signed)
 4-5 min discussion re active cigarette smoking in addition to office E&M  Ask about tobacco use:   active  Advise quitting   I took an extended  opportunity with this patient to outline the consequences of continued cigarette use  in airway disorders based on all the data we have from the multiple national lung health studies (perfomed over decades at millions of dollars in cost)  indicating that smoking cessation, not choice of inhalers or pulmonary physicians, is the most important aspect of her  care.   Assess willingness:  Not committed at this point Assist in quit attempt:  Per PCP when ready Arrange follow up:   Follow up per Primary Care planned

## 2024-02-08 NOTE — Assessment & Plan Note (Signed)
 Active smoker/ CTa 01/12/24   c/w mild ILD in pt with severe deforming RA - 02/08/2024   Walked on RA  x  3  lap(s) =  approx 750  ft  @ nl pace, stopped due to end of study  with lowest 02 sats 97%    Clearly she is not limited by her breathing but by RA and deconditioning at this point though she is at risk given how poorly controlled her RA is/ has been, which is the greatest risk for RA Lung dz including ILD   Best rx Treat / control the RA per rheum, not an easy task.  Monitor sats: Advised: Make sure you check your oxygen saturation at your highest level of activity(NOT after you stop)  to be sure it stays over 90% and keep track of it at least once a week, more often if breathing getting worse, and let me know if losing ground. (Collect the dots to connect the dots approach)    Each maintenance medication was reviewed in detail including emphasizing most importantly the difference between maintenance and prns and under what circumstances the prns are to be triggered using an action plan format where appropriate.  Total time for H and P, chart review, counseling, reviewing hfa/dpi/pulse ox  device(s) , directly observing portions of ambulatory 02 saturation study/ and generating customized AVS unique to this office visit / same day charting = 32 min summary final f/u ov

## 2024-02-09 ENCOUNTER — Encounter: Payer: Self-pay | Admitting: Family Medicine

## 2024-02-09 DIAGNOSIS — M069 Rheumatoid arthritis, unspecified: Secondary | ICD-10-CM

## 2024-02-16 ENCOUNTER — Other Ambulatory Visit: Payer: Self-pay | Admitting: Family Medicine

## 2024-02-16 ENCOUNTER — Other Ambulatory Visit: Payer: Self-pay | Admitting: Nurse Practitioner

## 2024-02-21 ENCOUNTER — Encounter: Payer: Self-pay | Admitting: Family Medicine

## 2024-02-21 DIAGNOSIS — R0602 Shortness of breath: Secondary | ICD-10-CM

## 2024-02-21 DIAGNOSIS — J449 Chronic obstructive pulmonary disease, unspecified: Secondary | ICD-10-CM

## 2024-02-21 MED ORDER — ALBUTEROL SULFATE HFA 108 (90 BASE) MCG/ACT IN AERS: 2.0000 | INHALATION_SPRAY | Freq: Four times a day (QID) | RESPIRATORY_TRACT | 3 refills | Status: AC | PRN

## 2024-02-25 ENCOUNTER — Other Ambulatory Visit (HOSPITAL_BASED_OUTPATIENT_CLINIC_OR_DEPARTMENT_OTHER): Payer: Self-pay

## 2024-02-26 ENCOUNTER — Encounter: Payer: Self-pay | Admitting: Family Medicine

## 2024-02-29 ENCOUNTER — Emergency Department

## 2024-02-29 ENCOUNTER — Other Ambulatory Visit: Payer: Self-pay

## 2024-02-29 ENCOUNTER — Emergency Department
Admission: EM | Admit: 2024-02-29 | Discharge: 2024-02-29 | Disposition: A | Discharge status: Home / Self Care | Attending: Emergency Medicine | Admitting: Emergency Medicine

## 2024-02-29 ENCOUNTER — Ambulatory Visit: Admitting: Psychiatry

## 2024-02-29 ENCOUNTER — Telehealth: Payer: Self-pay | Admitting: Psychiatry

## 2024-02-29 DIAGNOSIS — I10 Essential (primary) hypertension: Secondary | ICD-10-CM | POA: Insufficient documentation

## 2024-02-29 DIAGNOSIS — F1320 Sedative, hypnotic or anxiolytic dependence, uncomplicated: Secondary | ICD-10-CM | POA: Insufficient documentation

## 2024-02-29 DIAGNOSIS — Z79899 Other long term (current) drug therapy: Secondary | ICD-10-CM

## 2024-02-29 DIAGNOSIS — R42 Dizziness and giddiness: Secondary | ICD-10-CM | POA: Insufficient documentation

## 2024-02-29 DIAGNOSIS — R112 Nausea with vomiting, unspecified: Secondary | ICD-10-CM

## 2024-02-29 LAB — URINALYSIS, ROUTINE W REFLEX MICROSCOPIC
Bilirubin Urine: NEGATIVE
Glucose, UA: NEGATIVE mg/dL
Hgb urine dipstick: NEGATIVE
Ketones, ur: NEGATIVE mg/dL
Leukocytes,Ua: NEGATIVE
Nitrite: NEGATIVE
Protein, ur: NEGATIVE mg/dL
Specific Gravity, Urine: 1.023 (ref 1.005–1.030)
pH: 5 (ref 5.0–8.0)

## 2024-02-29 LAB — HEPATIC FUNCTION PANEL
ALT: 20 U/L (ref 0–44)
AST: 19 U/L (ref 15–41)
Albumin: 3.7 g/dL (ref 3.5–5.0)
Alkaline Phosphatase: 76 U/L (ref 38–126)
Bilirubin, Direct: <0.1 mg/dL (ref 0.0–0.2)
Total Bilirubin: 0.2 mg/dL (ref 0.0–1.2)
Total Protein: 7.8 g/dL (ref 6.5–8.1)

## 2024-02-29 LAB — BASIC METABOLIC PANEL WITH GFR
Anion gap: 9 (ref 5–15)
BUN: 15 mg/dL (ref 6–20)
CO2: 21 mmol/L — ABNORMAL LOW (ref 22–32)
Calcium: 9.2 mg/dL (ref 8.9–10.3)
Chloride: 104 mmol/L (ref 98–111)
Creatinine, Ser: 0.67 mg/dL (ref 0.44–1.00)
GFR, Estimated: >60 mL/min (ref >60)
Glucose, Bld: 135 mg/dL — ABNORMAL HIGH (ref 70–99)
Potassium: 4.2 mmol/L (ref 3.5–5.1)
Sodium: 134 mmol/L — ABNORMAL LOW (ref 135–145)

## 2024-02-29 LAB — LIPASE, BLOOD: Lipase: 22 U/L (ref 11–51)

## 2024-02-29 LAB — TROPONIN I (HIGH SENSITIVITY): Troponin I (High Sensitivity): 2 ng/L (ref <18)

## 2024-02-29 LAB — CBC
HCT: 40.2 % (ref 36.0–46.0)
Hemoglobin: 13.8 g/dL (ref 12.0–15.0)
MCH: 29 pg (ref 26.0–34.0)
MCHC: 34.3 g/dL (ref 30.0–36.0)
MCV: 84.5 fL (ref 80.0–100.0)
Platelets: 303 10*3/uL (ref 150–400)
RBC: 4.76 MIL/uL (ref 3.87–5.11)
RDW: 13.7 % (ref 11.5–15.5)
WBC: 9.2 10*3/uL (ref 4.0–10.5)
nRBC: 0 % (ref 0.0–0.2)

## 2024-02-29 MED ORDER — LORAZEPAM 0.5 MG PO TABS: 0.5000 mg | ORAL_TABLET | Freq: Once | ORAL | Status: DC

## 2024-02-29 MED ORDER — LORAZEPAM 0.5 MG PO TABS: 0.5000 mg | ORAL_TABLET | Freq: Four times a day (QID) | ORAL | 0 refills | Status: DC | PRN

## 2024-02-29 MED ORDER — ONDANSETRON 4 MG PO TBDP
4.0000 mg | ORAL_TABLET | Freq: Once | ORAL | Status: AC
  Administered 2024-02-29: 4 mg via ORAL
  Filled 2024-02-29: qty 1

## 2024-02-29 MED ORDER — LORAZEPAM 1 MG PO TABS
1.0000 mg | ORAL_TABLET | Freq: Once | ORAL | Status: AC
  Administered 2024-02-29: 1 mg via ORAL
  Filled 2024-02-29: qty 1

## 2024-02-29 NOTE — ED Notes (Signed)
 Pt standing @ bs reports vertigo is "better" pt speaking inf ull clear sentences appears in nad is on cellphone securing transport home via share ride

## 2024-02-29 NOTE — Telephone Encounter (Signed)
 PT did not want to come in the office for her 2PM New Patient appointment on 02/28/24. Her husband states that she is in the car having withdrawal symptoms from benzos. Per provider, PT was advised to go to the ED for the symptoms.

## 2024-02-29 NOTE — ED Provider Notes (Signed)
 [facility] Provider Note    Event Date/Time   First MD Initiated Contact with Patient 02/29/24 1716     (approximate)   History   Chief Complaint Dizziness   HPI  Veronica Rangel is a 58 y.o. female with past medical history of hypertension, rheumatoid arthritis, interstitial lung disease, fibromyalgia, and anxiety who presents to the ED complaining of dizziness.  Patient reports that she woke up this morning feeling dizzy with nausea whenever she stands up to move.  She states that she will feel lightheaded sometimes and other times feel like the room is spinning around her.  She denies feeling like she is going to pass out and does not have any chest pain, does report chronic shortness of breath that is no worse than usual today.  She does state that she has vomited multiple times.  She had an appointment scheduled to see a new psychiatrist and to have her Ativan refilled, but was not able to make it to the appointment due to vomiting.  Staff at her psychiatrist office recommended she come to the ED for evaluation.  Patient states that she feels fine when she is sitting still, but feels dizzy and nauseous when standing.     Physical Exam   Triage Vital Signs: ED Triage Vitals  Encounter Vitals Group     BP 02/29/24 1422 (!) 128/107     Systolic BP Percentile --      Diastolic BP Percentile --      Pulse Rate 02/29/24 1422 88     Resp 02/29/24 1422 18     Temp 02/29/24 1422 97.9 F (36.6 C)     Temp Source 02/29/24 1422 Oral     SpO2 02/29/24 1422 97 %     Weight 02/29/24 1423 204 lb 9.4 oz (92.8 kg)     Height 02/29/24 1423 5\' 4"  (1.626 m)     Head Circumference --      Peak Flow --      Pain Score 02/29/24 1422 7     Pain Loc --      Pain Education --      Exclude from Growth Chart --     Most recent vital signs: Vitals:   02/29/24 1830 02/29/24 1932  BP: (!) 160/72 (!) 145/91  Pulse: 76 77  Resp:  16  Temp:  98.3 F (36.8 C)  SpO2: 98%  99%    Constitutional: Alert and oriented. Eyes: Conjunctivae are normal. Head: Atraumatic. Nose: No congestion/rhinnorhea. Mouth/Throat: Mucous membranes are moist.  Cardiovascular: Normal rate, regular rhythm. Grossly normal heart sounds.  2+ radial pulses bilaterally. Respiratory: Normal respiratory effort.  No retractions. Lungs CTAB. Gastrointestinal: Soft and nontender. No distention. Musculoskeletal: No lower extremity tenderness nor edema.  Neurologic:  Normal speech and language. No gross focal neurologic deficits are appreciated.    ED Results / Procedures / Treatments   Labs (all labs ordered are listed, but only abnormal results are displayed) Labs Reviewed  BASIC METABOLIC PANEL WITH GFR - Abnormal; Notable for the following components:      Result Value   Sodium 134 (*)    CO2 21 (*)    Glucose, Bld 135 (*)    All other components within normal limits  URINALYSIS, ROUTINE W REFLEX MICROSCOPIC - Abnormal; Notable for the following components:   Color, Urine YELLOW (*)    APPearance HAZY (*)    All other components within normal limits  CBC  HEPATIC FUNCTION  PANEL  LIPASE, BLOOD  CBG MONITORING, ED  TROPONIN I (HIGH SENSITIVITY)     EKG  ED ECG REPORT I, Chesley Noon, the attending physician, personally viewed and interpreted this ECG.   Date: 02/29/2024  EKG Time: 14:25  Rate: 79  Rhythm: normal sinus rhythm  Axis: Normal  Intervals:none  ST&T Change: None  RADIOLOGY Chest x-ray reviewed and interpreted by me with no infiltrate, edema, or effusion.  PROCEDURES:  Critical Care performed: No  Procedures   MEDICATIONS ORDERED IN ED: Medications  ondansetron (ZOFRAN-ODT) disintegrating tablet 4 mg (4 mg Oral Given 02/29/24 1817)  LORazepam (ATIVAN) tablet 1 mg (1 mg Oral Given 02/29/24 1816)     IMPRESSION / MDM / ASSESSMENT AND PLAN / ED COURSE  I reviewed the triage vital signs and the nursing notes.                              58  y.o. female with past medical history of hypertension, rheumatoid arthritis, interstitial lung disease, fibromyalgia, and anxiety who presents to the ED complaining of dizziness and nausea with vomiting since waking up this morning.  Patient's presentation is most consistent with acute presentation with potential threat to life or bodily function.  Differential diagnosis includes, but is not limited to, stroke, peripheral vertigo, anemia, electrolyte abnormality, AKI, arrhythmia, pneumonia, CHF, COPD, orthostatic hypotension, vasovagal episode.  Patient nontoxic-appearing and in no acute distress, vital signs are unremarkable.  EKG shows no evidence of arrhythmia or ischemia, patient denies current shortness of breath and chest x-ray is unremarkable.  She has no focal neurologic deficits and symptoms seem to primarily occur when she stands up to try to walk.  Suspect peripheral vertigo and will treat with her usual Ativan as well as Zofran.  No signs of significant withdrawal at this time, will check CT head to evaluate for stroke.  Labs without significant anemia, leukocytosis, electrolyte abnormality, or AKI.  CT head is negative for acute process, patient feeling better on reassessment and I suspect peripheral vertigo.  We will prescribe 1 week supply of her usual dose of Ativan until she is able to be seen by her psychiatrist.  She was counseled to return to the ED for new or worsening symptoms.  Patient agrees with plan.      FINAL CLINICAL IMPRESSION(S) / ED DIAGNOSES   Final diagnoses:  Dizziness  Nausea and vomiting, unspecified vomiting type  Chronically on benzodiazepine therapy     Rx / DC Orders   ED Discharge Orders          Ordered    LORazepam (ATIVAN) 0.5 MG tablet  Every 6 hours PRN        02/29/24 2036             Note:  This document was prepared using Dragon voice recognition software and may include unintentional dictation errors.   Chesley Noon,  MD 02/29/24 2038

## 2024-02-29 NOTE — ED Notes (Signed)
 This RN gave report to [employer] and performed care handoff. Call light in reach, bed wheels locked, side rail raised, pt updated on plan of care. Rounding completed.

## 2024-02-29 NOTE — ED Triage Notes (Signed)
 Pt here with weakness and SOB. Pt has a hx of ILD and states she thinks she is going through withdrawals from her ativan, last administration was 3 days ago. Pt states she had a appointment with her psychiatrist but was unable to go due to being sick and vomiting. Pt states she has not been able to do much lately.

## 2024-03-02 ENCOUNTER — Other Ambulatory Visit: Payer: Self-pay | Admitting: Family Medicine

## 2024-03-02 DIAGNOSIS — M62838 Other muscle spasm: Secondary | ICD-10-CM

## 2024-03-05 ENCOUNTER — Encounter: Payer: Self-pay | Admitting: Family Medicine

## 2024-03-05 NOTE — Telephone Encounter (Signed)
 Veronica Rangel, Please see mychart message sent by pt and advise.

## 2024-03-06 ENCOUNTER — Ambulatory Visit (INDEPENDENT_AMBULATORY_CARE_PROVIDER_SITE_OTHER): Admitting: Family Medicine

## 2024-03-06 ENCOUNTER — Encounter: Payer: Self-pay | Admitting: Family Medicine

## 2024-03-06 VITALS — BP 126/87 | HR 91 | Temp 98.1°F | Ht 64.0 in | Wt 205.2 lb

## 2024-03-06 DIAGNOSIS — M2550 Pain in unspecified joint: Secondary | ICD-10-CM | POA: Insufficient documentation

## 2024-03-06 DIAGNOSIS — M62838 Other muscle spasm: Secondary | ICD-10-CM

## 2024-03-06 DIAGNOSIS — F411 Generalized anxiety disorder: Secondary | ICD-10-CM | POA: Diagnosis not present

## 2024-03-06 DIAGNOSIS — M48061 Spinal stenosis, lumbar region without neurogenic claudication: Secondary | ICD-10-CM | POA: Insufficient documentation

## 2024-03-06 DIAGNOSIS — M0510 Rheumatoid lung disease with rheumatoid arthritis of unspecified site: Secondary | ICD-10-CM

## 2024-03-06 DIAGNOSIS — F321 Major depressive disorder, single episode, moderate: Secondary | ICD-10-CM | POA: Diagnosis not present

## 2024-03-06 MED ORDER — BACLOFEN 5 MG PO TABS: 5.0000 mg | ORAL_TABLET | Freq: Three times a day (TID) | ORAL | 0 refills | Status: DC | PRN

## 2024-03-06 MED ORDER — LORAZEPAM 0.5 MG PO TABS: 0.5000 mg | ORAL_TABLET | Freq: Three times a day (TID) | ORAL | 0 refills | Status: DC | PRN

## 2024-03-06 NOTE — Progress Notes (Signed)
 Established Patient Office Visit  Subjective  Patient ID: Veronica Rangel, female    DOB: January 06, 1966  Age: 58 y.o. MRN: [id number]  Chief Complaint  Patient presents with   Follow-up    Pt. Here for a follow-up visit for mood.    Nakhia Levitan is a 58 year old female patient who presents today for mood follow-up. On 4/9, she went to the ED for acute anxiety and possible Ativan withdrawal.  Veronica Rangel- she reports last Wednesday "didn't have to be that way" and became tearful during the visit. She reports she was throwing up and having chest pain when she arrived at [facility] for her behavioral health appointment.  She was having withdrawals for 3 days prior going to the ED.  Reports her anxiety is increased due to her ILD diagnosis and difficulty seeing rheumatology.   Still taking Effexor- 225mg  daily, baclofen TID PRN, and propranolol 10mg  twice daily. She would like a refill of Baclofen today until she can see rheumatology.      03/06/2024    1:29 PM 01/24/2024    1:25 PM 09/27/2023    4:16 PM  PHQ9 SCORE ONLY  PHQ-9 Total Score 15 14 13       03/06/2024    1:31 PM 01/24/2024    1:27 PM 09/27/2023    4:17 PM 03/10/2023    1:48 PM  GAD 7 : Generalized Anxiety Score  Nervous, Anxious, on Edge 3 3 3 3   Control/stop worrying 3 3 2 3   Worry too much - different things 3 3 2 3   Trouble relaxing 3 2 3 3   Restless 0 2 0 0  Easily annoyed or irritable 1 1 1 2   Afraid - awful might happen 3 2 3 2   Total GAD 7 Score 16 16 14 16   Anxiety Difficulty Extremely difficult Not difficult at all Somewhat difficult    ROS: see HPI     Objective:     BP 126/87   Pulse 91   Temp 98.1 F (36.7 C) (Oral)   Ht 5\' 4"  (1.626 m)   Wt 205 lb 3.2 oz (93.1 kg)   BMI 35.22 kg/m  BP Readings from Last 3 Encounters:  03/06/24 126/87  02/29/24 (!) 145/91  02/08/24 108/68    Physical Exam Vitals reviewed.  Constitutional:      Appearance: Normal appearance.  Neurological:     Mental Status:  She is alert.  Psychiatric:        Mood and Affect: Mood is anxious. Affect is tearful.        Speech: Speech is rapid and pressured.        Behavior: Behavior is hyperactive.     Assessment & Plan:   1. Depression, major, single episode, moderate (HCC) (Primary) PHQ9 completed with score of 15. Currently taking Effexor 225mg  daily. Denies SI/HI.  Patient attempted to attend [facility] appointment last Wednesday 4/9 but was unable to make it due to vomiting. She went to the ED for peripheral vertigo. Unable to contact [facility] several times to speak to staff; however, multiple times received no answer. Due to limited business hours and open days, will place urgent referral to [facility] psychiatry. No refills needed at this time. - Ambulatory referral to Psychiatry  2. Generalized anxiety disorder GAD7 completed with score of 16. Patient is well-appearing and is in no acute distress. She did become tearful at the beginning of the visit due to her vomiting episode that occurred last week. No  focal neurological deficits or signs of withdrawal at this time. Unable to contact [facility] several times since referral was placed. Will place urgent referral for [facility] psychiatrist for continuity of care.  - LORazepam (ATIVAN) 0.5 MG tablet; Take 1 tablet (0.5 mg total) by mouth 3 (three) times daily as needed for anxiety.  Dispense: 90 tablet; Refill: 0 - Ambulatory referral to Psychiatry  3. Rheumatoid lung disease (HCC) Patient reports she experiences frequent muscle spasms, difficulty taking a deep breath, and history of ILD. She reports relief of muscle spasms with baclofen. Will send rx refill at this time. Discussed importance of rheumatology referral. Review of chart- patient has had elevated RF in the past with negative ANA. Will obtain records from [facility].  - Baclofen 5 MG TABS; Take 1 tablet (5 mg total) by mouth 3 (three) times daily as needed.  Dispense: 60 tablet; Refill:  0  4. Muscle spasm See #3 - Baclofen 5 MG TABS; Take 1 tablet (5 mg total) by mouth 3 (three) times daily as needed.  Dispense: 60 tablet; Refill: 0     Return in about 4 weeks (around 04/03/2024) for Mood f/u & referrals .    Veronica Hanson, FNP

## 2024-03-06 NOTE — Patient Instructions (Signed)

## 2024-03-21 ENCOUNTER — Other Ambulatory Visit: Payer: Self-pay | Admitting: Nurse Practitioner

## 2024-03-22 ENCOUNTER — Encounter: Payer: Self-pay | Admitting: Family Medicine

## 2024-03-22 ENCOUNTER — Ambulatory Visit: Admitting: Psychiatry

## 2024-03-22 ENCOUNTER — Other Ambulatory Visit: Payer: Self-pay | Admitting: Family Medicine

## 2024-03-24 NOTE — Progress Notes (Deleted)
 Psychiatric Initial Adult Assessment   Patient Identification: Veronica Rangel MRN:  [id number] Date of Evaluation:  03/24/2024 Referral Source: *** Chief Complaint:  No chief complaint on file.  Visit Diagnosis: No diagnosis found.  History of Present Illness:  ***  Associated Signs/Symptoms: Depression Symptoms:  {DEPRESSION SYMPTOMS:20000} (Hypo) Manic Symptoms:  {BHH MANIC SYMPTOMS:22872} Anxiety Symptoms:  {BHH ANXIETY SYMPTOMS:22873} Psychotic Symptoms:  {BHH PSYCHOTIC SYMPTOMS:22874} PTSD Symptoms: {BHH PTSD SYMPTOMS:22875}  Past Psychiatric History: ***  Previous Psychotropic Medications: {YES/NO:21197}  Substance Abuse History in the last 12 months:  {yes no:314532}  Consequences of Substance Abuse: {BHH CONSEQUENCES OF SUBSTANCE ABUSE:22880}  Past Medical History:  Past Medical History:  Diagnosis Date   Anxiety    Cancer (HCC)    renal   COPD (chronic obstructive pulmonary disease) (HCC)    Depression    Encounter for screening for respiratory tuberculosis 06/15/2017   Fibromyalgia    GERD (gastroesophageal reflux disease)    Hypertension    RA (rheumatoid arthritis) (HCC)    Shingles     Past Surgical History:  Procedure Laterality Date   ADRENALECTOMY Left    APPENDECTOMY     CESAREAN SECTION     CHOLECYSTECTOMY     NEPHRECTOMY RADICAL     TUBAL LIGATION      Family Psychiatric History: ***  Family History:  Family History  Problem Relation Age of Onset   Cancer Father        lung   Heart disease Maternal Grandfather    Diabetes Maternal Grandfather     Social History:   Social History   Socioeconomic History   Marital status: Married    Spouse name: Not on file   Number of children: Not on file   Years of education: Not on file   Highest education level: Associate degree: occupational, Scientist, product/process development, or vocational program  Occupational History   Not on file  Tobacco Use   Smoking status: Some Days    Types: E-cigarettes   Smokeless  tobacco: Never   Tobacco comments:    Reports she quit cigarette smoking about 04/11/2023    States she does participate in vaping   Vaping Use   Vaping status: Never Used  Substance and Sexual Activity   Alcohol use: Never   Drug use: Never   Sexual activity: Not Currently    Birth control/protection: None  Other Topics Concern   Not on file  Social History Narrative   Not on file   Social Drivers of Health   Financial Resource Strain: Low Risk  (09/27/2023)   Overall Financial Resource Strain (CARDIA)    Difficulty of Paying Living Expenses: Not hard at all  Food Insecurity: No Food Insecurity (09/27/2023)   Hunger Vital Sign    Worried About Running Out of Food in the Last Year: Never true    Ran Out of Food in the Last Year: Never true  Transportation Needs: No Transportation Needs (09/27/2023)   PRAPARE - Administrator, Civil Service (Medical): No    Lack of Transportation (Non-Medical): No  Physical Activity: Unknown (09/27/2023)   Exercise Vital Sign    Days of Exercise per Week: 0 days    Minutes of Exercise per Session: Not on file  Stress: Stress Concern Present (09/27/2023)   [employer] of Occupational Health - Occupational Stress Questionnaire    Feeling of Stress : Very much  Social Connections: Socially Isolated (09/27/2023)   Social Connection and Isolation Panel [NHANES]  Frequency of Communication with Friends and Family: Once a week    Frequency of Social Gatherings with Friends and Family: Never    Attends Religious Services: Never    Database administrator or Organizations: No    Attends Engineer, structural: Not on file    Marital Status: Married    Additional Social History: ***  Allergies:   Allergies  Allergen Reactions   Codeine Anaphylaxis and Other (See Comments)   Amoxicillin-Pot Clavulanate Rash   Prednisone  Anxiety    PO     Metabolic Disorder Labs: Lab Results  Component Value Date   HGBA1C 5.6  09/27/2023   No results found for: "PROLACTIN" Lab Results  Component Value Date   CHOL 223 (H) 11/08/2022   TRIG 239.0 (H) 11/08/2022   HDL 48.60 11/08/2022   CHOLHDL 5 11/08/2022   VLDL 47.8 (H) 11/08/2022   Lab Results  Component Value Date   TSH 3.696 05/05/2022    Therapeutic Level Labs: No results found for: "LITHIUM" No results found for: "CBMZ" No results found for: "VALPROATE"  Current Medications: Current Outpatient Medications  Medication Sig Dispense Refill   albuterol  (VENTOLIN  HFA) 108 (90 Base) MCG/ACT inhaler Inhale 2 puffs into the lungs every 6 (six) hours as needed for wheezing or shortness of breath. 8 g 3   Baclofen  5 MG TABS Take 1 tablet (5 mg total) by mouth 3 (three) times daily as needed. 60 tablet 0   ipratropium-albuterol  (DUONEB) 0.5-2.5 (3) MG/3ML SOLN Take 3 mLs by nebulization every 4 (four) hours as needed. 90 mL 5   LORazepam  (ATIVAN ) 0.5 MG tablet Take 1 tablet (0.5 mg total) by mouth 3 (three) times daily as needed for anxiety. 90 tablet 0   propranolol  (INDERAL ) 10 MG tablet Take 1 tablet (10 mg total) by mouth 2 (two) times daily. 60 tablet 2   venlafaxine  XR (EFFEXOR  XR) 75 MG 24 hr capsule Take 1 capsule (75 mg total) by mouth daily with breakfast. Take with venlafaxine  150mg  to equal 225mg  daily 30 capsule 2   venlafaxine  XR (EFFEXOR -XR) 150 MG 24 hr capsule Take 1 capsule (150 mg total) by mouth daily with breakfast. Take with venlafaxine  75mg  to equal venlafaxine  225mg  daily 30 capsule 2   No current facility-administered medications for this visit.    Musculoskeletal: Strength & Muscle Tone: {desc; muscle tone:32375} Gait & Station: {PE GAIT ED WUJW:11914} Patient leans: {Patient Leans:21022755}  Psychiatric Specialty Exam: Review of Systems  There were no vitals taken for this visit.There is no height or weight on file to calculate BMI.  General Appearance: {Appearance:22683}  Eye Contact:  {BHH EYE CONTACT:22684}  Speech:   {Speech:22685}  Volume:  {Volume (PAA):22686}  Mood:  {BHH MOOD:22306}  Affect:  {Affect (PAA):22687}  Thought Process:  {Thought Process (PAA):22688}  Orientation:  {BHH ORIENTATION (PAA):22689}  Thought Content:  {Thought Content:22690}  Suicidal Thoughts:  {ST/HT (PAA):22692}  Homicidal Thoughts:  {ST/HT (PAA):22692}  Memory:  {BHH MEMORY:22881}  Judgement:  {Judgement (PAA):22694}  Insight:  {Insight (PAA):22695}  Psychomotor Activity:  {Psychomotor (PAA):22696}  Concentration:  {Concentration:21399}  Recall:  {BHH GOOD/FAIR/POOR:22877}  Fund of Knowledge:{BHH GOOD/FAIR/POOR:22877}  Language: {BHH GOOD/FAIR/POOR:22877}  Akathisia:  {BHH YES OR NO:22294}  Handed:  {Handed:22697}  AIMS (if indicated):  {Desc; done/not:10129}  Assets:  {Assets (PAA):22698}  ADL's:  {BHH NWG'N:56213}  Cognition: {chl bhh cognition:304700322}  Sleep:  {BHH GOOD/FAIR/POOR:22877}   Screenings: GAD-7    Flowsheet Row Office Visit from 03/06/2024 in [facility] Primary  Care at [facility] Visit from 01/24/2024 in [facility] Primary Care at [facility] Visit from 09/27/2023 in [facility] Primary Care & Sports Medicine at [facility] Visit from 03/10/2023 in [facility]Care at [facility] Visit from 11/08/2022 in [facility] HealthCare at [employer]  Total GAD-7 Score 16 16 14 16 13       PHQ2-9    Flowsheet Row Office Visit from 03/06/2024 in [facility] Primary Care at [facility] Visit from 01/24/2024 in [facility] Primary Care at [facility] Visit from 09/27/2023 in [facility] Primary Care & Sports Medicine at [facility] Visit from 03/10/2023 in [facility] HealthCare at [facility] Visit from 11/08/2022 in [facility] at [facility] Total Score 5 4 5 5 5   PHQ-9 Total Score 15 14 13 16 13       Flowsheet Row ED from 02/29/2024 in [facility] Emergency Department  at [facility] ED from 01/23/2024 in [facility] Emergency Department at [facility] ED from 01/15/2024 in [facility] Emergency Department at [facility]  C-SSRS RISK CATEGORY No Risk No Risk No Risk       Assessment and Plan: ***  Collaboration of Care: [facility] OP Collaboration of Care:[id number]}  Patient/Guardian was advised Release of Information must be obtained prior to any record release in order to collaborate their care with an outside provider. Patient/Guardian was advised if they have not already done so to contact the registration department to sign all necessary forms in order for us  to release information regarding their care.   Consent: Patient/Guardian gives verbal consent for treatment and assignment of benefits for services provided during this visit. Patient/Guardian expressed understanding and agreed to proceed.   Todd Fossa, MD 5/3/20259:44 AM

## 2024-03-26 ENCOUNTER — Other Ambulatory Visit: Payer: Self-pay | Admitting: Family Medicine

## 2024-03-26 DIAGNOSIS — M62838 Other muscle spasm: Secondary | ICD-10-CM

## 2024-03-26 DIAGNOSIS — M0510 Rheumatoid lung disease with rheumatoid arthritis of unspecified site: Secondary | ICD-10-CM

## 2024-03-27 ENCOUNTER — Other Ambulatory Visit: Payer: Self-pay | Admitting: Family Medicine

## 2024-03-27 DIAGNOSIS — M0510 Rheumatoid lung disease with rheumatoid arthritis of unspecified site: Secondary | ICD-10-CM

## 2024-03-27 DIAGNOSIS — M62838 Other muscle spasm: Secondary | ICD-10-CM

## 2024-03-27 MED ORDER — BACLOFEN 5 MG PO TABS: 5.0000 mg | ORAL_TABLET | Freq: Two times a day (BID) | ORAL | 0 refills | Status: DC | PRN

## 2024-03-29 ENCOUNTER — Ambulatory Visit: Admitting: Psychiatry

## 2024-04-03 ENCOUNTER — Encounter: Payer: Self-pay | Admitting: Family Medicine

## 2024-04-05 ENCOUNTER — Ambulatory Visit (INDEPENDENT_AMBULATORY_CARE_PROVIDER_SITE_OTHER): Admitting: Family Medicine

## 2024-04-05 ENCOUNTER — Encounter: Payer: Self-pay | Admitting: Family Medicine

## 2024-04-05 VITALS — BP 139/89 | HR 74 | Temp 97.4°F | Resp 18 | Ht 64.0 in | Wt 203.0 lb

## 2024-04-05 DIAGNOSIS — M05741 Rheumatoid arthritis with rheumatoid factor of right hand without organ or systems involvement: Secondary | ICD-10-CM

## 2024-04-05 DIAGNOSIS — M797 Fibromyalgia: Secondary | ICD-10-CM

## 2024-04-05 DIAGNOSIS — F411 Generalized anxiety disorder: Secondary | ICD-10-CM | POA: Diagnosis not present

## 2024-04-05 DIAGNOSIS — M059 Rheumatoid arthritis with rheumatoid factor, unspecified: Secondary | ICD-10-CM | POA: Diagnosis not present

## 2024-04-05 DIAGNOSIS — M05742 Rheumatoid arthritis with rheumatoid factor of left hand without organ or systems involvement: Secondary | ICD-10-CM

## 2024-04-05 DIAGNOSIS — M0510 Rheumatoid lung disease with rheumatoid arthritis of unspecified site: Secondary | ICD-10-CM

## 2024-04-05 DIAGNOSIS — M62838 Other muscle spasm: Secondary | ICD-10-CM

## 2024-04-05 DIAGNOSIS — R768 Other specified abnormal immunological findings in serum: Secondary | ICD-10-CM

## 2024-04-05 MED ORDER — DULOXETINE HCL 20 MG PO CPEP: 20.0000 mg | ORAL_CAPSULE | Freq: Every day | ORAL | 1 refills | Status: DC

## 2024-04-05 MED ORDER — BACLOFEN 5 MG PO TABS: 5.0000 mg | ORAL_TABLET | Freq: Two times a day (BID) | ORAL | 0 refills | Status: AC | PRN

## 2024-04-05 MED ORDER — LORAZEPAM 0.5 MG PO TABS: 0.5000 mg | ORAL_TABLET | Freq: Three times a day (TID) | ORAL | 0 refills | Status: DC | PRN

## 2024-04-05 NOTE — Progress Notes (Signed)
 Established Patient Office Visit  Subjective  Patient ID: Veronica Rangel, female    DOB: 01-03-1966  Age: 58 y.o. MRN: [id number]  Chief Complaint  Patient presents with   Anxiety   Depression   Ms. Lewers is a 58 year old female with complex psychiatric and medical issues. She has a past medical history of ILD, RA, fibromyalgia, and chronic pain syndrome. She presents to the visit in a wheelchair and is tearful and upset. She reports she is in a lot of pain and is concerned about her lung disease and rheumatoid arthritis.  "I can't function no more. I can't walk. I am staying in bed everyday because of my pain. My body is full on inflammation and it hurts so bad."  She is frustrated since she is not receiving any medical answers. Her rheumatology referral has been "in process" for a while and she is concerned since Dr. Waymond Hailey (pulmonologist) diagnosed her with ILD r/t RA back in June 2023 (after being hospitalized for shob). She has seen [facility] in 2021- Dr. Aron Bien & Velna Ghee, PA-C for medication for RA. She received cervical, thoracic, and lumbar trigger point injections for RA. She was recommended to see a rheumatology specialist with [facility]- unsure why she never did. Both RA and CCP are positive.      04/05/2024    2:47 PM 03/06/2024    1:31 PM 01/24/2024    1:27 PM 09/27/2023    4:17 PM  GAD 7 : Generalized Anxiety Score  Nervous, Anxious, on Edge 3 3 3 3   Control/stop worrying 3 3 3 2   Worry too much - different things 3 3 3 2   Trouble relaxing 3 3 2 3   Restless 0 0 2 0  Easily annoyed or irritable 1 1 1 1   Afraid - awful might happen 3 3 2 3   Total GAD 7 Score 16 16 16 14   Anxiety Difficulty Very difficult Extremely difficult Not difficult at all Somewhat difficult       04/05/2024    2:47 PM 03/06/2024    1:29 PM 01/24/2024    1:25 PM  PHQ9 SCORE ONLY  PHQ-9 Total Score 12 15 14    ROS: see HPI     Objective:      BP 139/89 (BP Location: Left Arm, Patient  Position: Sitting, Cuff Size: Normal)   Pulse 74   Temp (!) 97.4 F (36.3 C) (Oral)   Resp 18   Ht 5\' 4"  (1.626 m)   Wt 203 lb (92.1 kg)   SpO2 96%   BMI 34.84 kg/m  BP Readings from Last 3 Encounters:  04/05/24 139/89  03/06/24 126/87  02/29/24 (!) 145/91     Physical Exam Vitals reviewed.  Constitutional:      Appearance: Normal appearance.  Cardiovascular:     Rate and Rhythm: Normal rate and regular rhythm.     Pulses: Normal pulses.     Heart sounds: Normal heart sounds.  Pulmonary:     Effort: Pulmonary effort is normal.     Breath sounds: Normal breath sounds.  Neurological:     Mental Status: She is alert.  Psychiatric:        Mood and Affect: Mood normal.        Behavior: Behavior normal.       Assessment & Plan:    1. Generalized anxiety disorder (Primary) GAD7 completed with score of 16. Reports issues with panic attacks, shortness of breath, difficulty breathing, palpitations, hyperventilation, and dizziness. Counseled patient importance  of following with psychiatry in adjusting her anxiety/depression medications and following her anti-anxiety medication. Patient will consider. She reports that she has seen various psychiatrists in the past and, when she finds a good one and becomes established, they end up changing to another practice. Patient reports she has been in therapy in the past and did not notice any improvement- declines at this time. Patient reports her anxiety is still elevated. She reports that she takes Ativan  twice to three times per day depending on her anxiety level. Patient would like to restart Cymbalta , since it helped with her fibromyalgia. Discussed weaning off Effexor  (down to 175mg  daily) and simultaneously starting low-dose Cymbalta  (20mg  daily).  - LORazepam  (ATIVAN ) 0.5 MG tablet; Take 1 tablet (0.5 mg total) by mouth 3 (three) times daily as needed for anxiety.  Dispense: 90 tablet; Refill: 0 - DULoxetine  (CYMBALTA ) 20 MG capsule; Take  1 capsule (20 mg total) by mouth daily.  Dispense: 30 capsule; Refill: 1  2. Rheumatoid arthritis involving both hands with positive rheumatoid factor (HCC) Referral placed to rheumatology- still pending.   3. Fibromyalgia Referral placed to rheumatology- still pending.  - DULoxetine  (CYMBALTA ) 20 MG capsule; Take 1 capsule (20 mg total) by mouth daily.  Dispense: 30 capsule; Refill: 1  4. Seropositive rheumatoid arthritis (HCC) Referral placed to rheumatology- still pending.   5. Rheumatoid lung disease (HCC) Referral placed to rheumatology- still pending.  - Baclofen  5 MG TABS; Take 1 tablet (5 mg total) by mouth 2 (two) times daily as needed.  Dispense: 60 tablet; Refill: 0  6. ANA positive Referral placed to rheumatology- still pending.   7. Muscle spasm Referral placed to rheumatology- still pending. Need assistance with further management of RA.  - Baclofen  5 MG TABS; Take 1 tablet (5 mg total) by mouth 2 (two) times daily as needed.  Dispense: 60 tablet; Refill: 0  Return in about 4 weeks (around 05/03/2024) for Mood f/u.   Spent 45 minutes on this patient encounter, including preparation, chart review, face-to-face counseling with patient and coordination of care, and documentation of encounter.    Wilhelmena Hanson, FNP

## 2024-04-05 NOTE — Patient Instructions (Signed)
 MyChart:  For all urgent or time sensitive needs we ask that you please call the office to avoid delays. Our number is [phone]) [phone]. MyChart is not constantly monitored and due to the large volume of messages a day, replies may take up to 72 business hours.   MyChart Policy: MyChart allows for you to see your visit notes, after visit summary, provider recommendations, lab and tests results, make an appointment, request refills, and contact your provider or the office for non-urgent questions or concerns. Providers are seeing patients during normal business hours and do not have built in time to review MyChart messages.  We ask that you allow a minimum of 3 business days for responses to [employer]. For this reason, please do not send urgent requests through MyChart. Please call the office at [phone]. New and ongoing conditions may require a visit. We have virtual and in person visit available for your convenience.  Complex MyChart concerns may require a visit. Your provider may request you schedule a virtual or in person visit to ensure we are providing the best care possible. MyChart messages sent after 11:00 AM on Friday will not be received by the provider until Monday morning.    Lab and Test Results: You will receive your lab and test results on MyChart as soon as they are completed and results have been sent by the lab or testing facility. Due to this service, you will receive your results BEFORE your provider.  I review lab and tests results each morning prior to seeing patients. Some results require collaboration with other providers to ensure you are receiving the most appropriate care. For this reason, we ask that you please allow a minimum of 3-5 business days from the time the ALL results have been received for your provider to receive and review lab and test results and contact you about these.  Most lab and test result comments from the provider will be sent through MyChart.  Your provider may recommend changes to the plan of care, follow-up visits, repeat testing, ask questions, or request an office visit to discuss these results. You may reply directly to this message or call the office at [phone] to provide information for the provider or set up an appointment. In some instances, you will be called with test results and recommendations. Please let us know if this is preferred and we will make note of this in your chart to provide this for you.    If you have not heard a response to your lab or test results in 5 business days from all results returning to MyChart, please call the office to let us know. We ask that you please avoid calling prior to this time unless there is an emergent concern. Due to high call volumes, this can delay the resulting process.   After Hours: For all non-emergency after hours needs, please call the office at [phone] and select the option to reach the on-call provider service. On-call services are shared between multiple [place] offices and therefore it will not be possible to speak directly with your provider. On-call providers may provide medical advice and recommendations, but are unable to provide refills for maintenance medications.  For all emergency or urgent medical needs after normal business hours, we recommend that you seek care at the closest Urgent Care or Emergency Department to ensure appropriate treatment in a timely manner.  [facility] has a 24 hour emergency room located on the ground floor for your  convenience.    Urgent Concerns During the Business Day Providers are seeing patients from 8AM to 5PM, Monday through Thursday, and 8AM to 12PM on Friday with a busy schedule and are most often not able to respond to non-urgent calls until the end of the day or the next business day. If you should have URGENT concerns during the day, please call and speak to the nurse or schedule a same day  appointment so that we can address your concern without delay.    Thank you, again, for choosing me as your health care partner. I appreciate your trust and look forward to learning more about you.    Veronica Reedy, FNP-C

## 2024-04-11 ENCOUNTER — Ambulatory Visit: Admitting: Psychiatry

## 2024-04-25 ENCOUNTER — Encounter: Payer: Self-pay | Admitting: Family Medicine

## 2024-04-25 DIAGNOSIS — F411 Generalized anxiety disorder: Secondary | ICD-10-CM

## 2024-04-26 ENCOUNTER — Other Ambulatory Visit: Payer: Self-pay | Admitting: Family Medicine

## 2024-04-26 DIAGNOSIS — F411 Generalized anxiety disorder: Secondary | ICD-10-CM

## 2024-04-26 MED ORDER — VENLAFAXINE HCL ER 75 MG PO CP24: 75.0000 mg | ORAL_CAPSULE | Freq: Every day | ORAL | 2 refills | Status: DC

## 2024-04-26 MED ORDER — VENLAFAXINE HCL ER 150 MG PO CP24: 150.0000 mg | ORAL_CAPSULE | Freq: Every day | ORAL | 2 refills | Status: DC

## 2024-05-03 ENCOUNTER — Ambulatory Visit: Admitting: Family Medicine

## 2024-05-04 ENCOUNTER — Other Ambulatory Visit: Payer: Self-pay | Admitting: Family Medicine

## 2024-05-04 DIAGNOSIS — F411 Generalized anxiety disorder: Secondary | ICD-10-CM

## 2024-05-04 MED ORDER — LORAZEPAM 0.5 MG PO TABS: 0.5000 mg | ORAL_TABLET | Freq: Two times a day (BID) | ORAL | 0 refills | Status: DC | PRN

## 2024-05-04 NOTE — Progress Notes (Signed)
 PDMP reviewed, no red flags present. Rx sent to pharmacy on file.

## 2024-05-08 ENCOUNTER — Telehealth: Payer: Self-pay | Admitting: Family Medicine

## 2024-05-08 NOTE — Telephone Encounter (Signed)
 Patient stated that KB said her appt be changed to a Mychart video visit.  Patient requested appt. on 05/15/24

## 2024-05-09 ENCOUNTER — Ambulatory Visit: Admitting: Family Medicine

## 2024-05-11 ENCOUNTER — Encounter: Payer: Self-pay | Admitting: Family Medicine

## 2024-05-14 ENCOUNTER — Encounter: Payer: Self-pay | Admitting: Family Medicine

## 2024-05-14 ENCOUNTER — Telehealth (INDEPENDENT_AMBULATORY_CARE_PROVIDER_SITE_OTHER): Admitting: Family Medicine

## 2024-05-14 VITALS — BP 124/60 | HR 78

## 2024-05-14 DIAGNOSIS — M05741 Rheumatoid arthritis with rheumatoid factor of right hand without organ or systems involvement: Secondary | ICD-10-CM

## 2024-05-14 DIAGNOSIS — M797 Fibromyalgia: Secondary | ICD-10-CM

## 2024-05-14 DIAGNOSIS — F411 Generalized anxiety disorder: Secondary | ICD-10-CM | POA: Diagnosis not present

## 2024-05-14 DIAGNOSIS — J849 Interstitial pulmonary disease, unspecified: Secondary | ICD-10-CM | POA: Insufficient documentation

## 2024-05-14 DIAGNOSIS — M0510 Rheumatoid lung disease with rheumatoid arthritis of unspecified site: Secondary | ICD-10-CM

## 2024-05-14 DIAGNOSIS — M05742 Rheumatoid arthritis with rheumatoid factor of left hand without organ or systems involvement: Secondary | ICD-10-CM | POA: Diagnosis not present

## 2024-05-14 MED ORDER — LORAZEPAM 0.5 MG PO TABS: 0.5000 mg | ORAL_TABLET | Freq: Three times a day (TID) | ORAL | 0 refills | Status: DC | PRN

## 2024-05-14 MED ORDER — NICOTINE POLACRILEX 2 MG MT LOZG: 2.0000 mg | LOZENGE | OROMUCOSAL | 1 refills | Status: DC | PRN

## 2024-05-14 NOTE — Progress Notes (Signed)
 Virtual Visit via Video Note  I connected with Veronica Rangel on 05/14/24 at 10:45AM by a video enabled telemedicine application and verified that I am speaking with the correct person using two identifiers.I discussed the limitations, risks, security, and privacy concerns of performing an evaluation and management service by video and the availability of in person appointments. I also discussed with the patient that there may be a patient responsible charge related to this service. The patient expressed understanding and agreed to proceed.  Patient Location: Home Provider Location: Office/Clinic  Veronica Rangel is a 58 year old female patient who presents today for anxiety management and discussion of her musculoskeletal pain. She has a complex psychiatric and medical history. She is concerned about her diagnosis of ILD and the effect her RA is having on her breathing and progression of this disease. She has a difficult time maintaining consistent follow-up appointments.   Review of her chart, patient was referred to rheumatology. She reports she wasn't able to get an appt until Dec and did not schedule with them. Advised patient to call back and schedule for soonest available appt and get on the waitlist.   Patient has seen [facility] Sisto Fonder, PA-C) in 2021 for cervical, thoracic, and lumbar trigger point injections for pain relief. Appears she was last seen in 2022 through [facility]. In this note, patient was referred to Dr. Larri for RA treatment-- unclear if she ever followed up. Patient reports she has been on leflunomide Harper) in the past for RA treatment. Review of previous labs, ANA negative, RF 415, and CCP positive.    Last OV, we discussed weaning off Effexor  and re-starting Cymbalta  due to chronic pain and diagnosis of fibromyalgia syndrome. However, she reports she is only taking Effexor .   ILD- saw Dr. Darlean 02/08/2024 and recommended f/us  PRN  CT angio chest completed  12/2023: Stable peripheral interstitial and ground-glass opacities in the bilateral upper lobes. Findings may be related to chronic interstitial lung disease.  Subjective: PCP: Towana Small, FNP  Chief Complaint  Patient presents with   Generalized anxiety disorder    Patient is needing refills on her ativan .   ROS: Per HPI  Current Outpatient Medications:    albuterol  (VENTOLIN  HFA) 108 (90 Base) MCG/ACT inhaler, Inhale 2 puffs into the lungs every 6 (six) hours as needed for wheezing or shortness of breath., Disp: 8 g, Rfl: 3   ipratropium-albuterol  (DUONEB) 0.5-2.5 (3) MG/3ML SOLN, Take 3 mLs by nebulization every 4 (four) hours as needed., Disp: 90 mL, Rfl: 5   nicotine polacrilex (COMMIT) 2 MG lozenge, Take 1 lozenge (2 mg total) by mouth as needed for smoking cessation., Disp: 100 tablet, Rfl: 1   propranolol  (INDERAL ) 10 MG tablet, Take 1 tablet (10 mg total) by mouth 2 (two) times daily., Disp: 60 tablet, Rfl: 2   venlafaxine  XR (EFFEXOR  XR) 75 MG 24 hr capsule, Take 1 capsule (75 mg total) by mouth daily with breakfast. Take with venlafaxine  150mg  to equal 225mg  daily, Disp: 30 capsule, Rfl: 2   venlafaxine  XR (EFFEXOR -XR) 150 MG 24 hr capsule, Take 1 capsule (150 mg total) by mouth daily with breakfast. Take with venlafaxine  75mg  to equal venlafaxine  225mg  daily, Disp: 30 capsule, Rfl: 2   LORazepam  (ATIVAN ) 0.5 MG tablet, Take 1 tablet (0.5 mg total) by mouth 3 (three) times daily as needed for anxiety., Disp: 90 tablet, Rfl: 0  Observations/Objective: Today's Vitals   05/14/24 1100  BP: 124/60  Pulse: 78  SpO2: 97%  General: Alert and oriented x 4. Speaking in clear and full sentences, no audible heavy breathing, no acute distress.  Sounds alert and appropriately interactive.  Appears well.  Face symmetric.  Extraocular movements intact.  Pupils equal and round.  No nasal flaring or accessory muscle use visualized.  Assessment and Plan: 1. Generalized anxiety disorder  (Primary) Patient is a pleasant 58 year old female patient who presents today for management of her anxiety. She reports with her recent diagnosis of ILD, due to uncontrolled RA, she has frequent panic attacks and lives in a constant state of anxiety. She presents today for refill of her Ativan . Last OV, we discussed decreasing her Ativan  from three times daily as needed to twice daily as needed. She reports her anxiety is always at an elevated level. She was referred to psychiatry but did not follow-up with them-- most likely due to cost. Discussed I can continue to manage this medication, as long as she has consistent and close follow-ups. PDMP reviewed, no red flags present. Counseled patient to try taking twice daily PRN on days where her anxiety is less than normal. Rx sent to pharmacy on file. Follow-up in 4 weeks.  - LORazepam  (ATIVAN ) 0.5 MG tablet; Take 1 tablet (0.5 mg total) by mouth 3 (three) times daily as needed for anxiety.  Dispense: 90 tablet; Refill: 0  2. Fibromyalgia Last OV, patient and I discussed weaning off Effexor  and re-starting Cymbalta  for chronic pain management and fibromyalgia symptoms. She reports she was nervous to start taking the Cymbalta .   3. Rheumatoid arthritis involving both hands with positive rheumatoid factor (HCC) Patient is concerned about her untreated rheumatoid arthritis (RA) affecting her lung function. Patient reports she has been on leflunomide Harper) in the past for RA treatment. Review of previous labs, ANA negative, RF 415, and CCP positive. Last OV, she was referred to rheumatology and reports she wasn't able to get an appt until Dec and did not schedule with them. Advised patient to call back and schedule for soonest available appt and get on the waitlist. She is agreeable to this. I will personally reach out to referral coordinator to help facilitate this.   4. Rheumatoid lung disease (HCC) See #5  5. ILD (interstitial lung disease)  (HCC) Patient would benefit from at least yearly pulmonology visits. Saw Dr. Darlean last 02/08/2024 with recommended PRN visits. At this time, patient would most benefit from management of her RA. Once this is under management, would be reasonable to re-schedule annual visit with Dr. Darlean.    Follow Up Instructions: Return in about 4 weeks (around 06/11/2024) for rheum arthritis f/u & mood f/u.   I discussed the assessment and treatment plan with the patient. The patient was provided an opportunity to ask questions, and all were answered. The patient agreed with the plan and demonstrated an understanding of the instructions.   The patient was advised to call back or seek an in-person evaluation if the symptoms worsen or if the condition fails to improve as anticipated.  The above assessment and management plan was discussed with the patient. The patient verbalized understanding of and has agreed to the management plan.   Evalene Arts, FNP

## 2024-05-14 NOTE — Patient Instructions (Signed)

## 2024-05-15 ENCOUNTER — Ambulatory Visit: Admitting: Family Medicine

## 2024-05-21 ENCOUNTER — Encounter: Payer: Self-pay | Admitting: Family Medicine

## 2024-05-21 ENCOUNTER — Other Ambulatory Visit: Payer: Self-pay | Admitting: Family Medicine

## 2024-05-21 DIAGNOSIS — M797 Fibromyalgia: Secondary | ICD-10-CM

## 2024-05-21 MED ORDER — METHOCARBAMOL 500 MG PO TABS: 500.0000 mg | ORAL_TABLET | Freq: Four times a day (QID) | ORAL | 2 refills | Status: DC | PRN

## 2024-06-07 ENCOUNTER — Ambulatory Visit: Admitting: Family Medicine

## 2024-06-12 ENCOUNTER — Ambulatory Visit (INDEPENDENT_AMBULATORY_CARE_PROVIDER_SITE_OTHER): Admitting: Family Medicine

## 2024-06-12 ENCOUNTER — Encounter: Payer: Self-pay | Admitting: Family Medicine

## 2024-06-12 VITALS — BP 124/84 | HR 103 | Resp 16 | Ht 64.0 in | Wt 206.0 lb

## 2024-06-12 DIAGNOSIS — J849 Interstitial pulmonary disease, unspecified: Secondary | ICD-10-CM

## 2024-06-12 DIAGNOSIS — F411 Generalized anxiety disorder: Secondary | ICD-10-CM

## 2024-06-12 DIAGNOSIS — M0579 Rheumatoid arthritis with rheumatoid factor of multiple sites without organ or systems involvement: Secondary | ICD-10-CM

## 2024-06-12 DIAGNOSIS — M797 Fibromyalgia: Secondary | ICD-10-CM | POA: Diagnosis not present

## 2024-06-12 DIAGNOSIS — R911 Solitary pulmonary nodule: Secondary | ICD-10-CM | POA: Diagnosis not present

## 2024-06-12 DIAGNOSIS — Z87891 Personal history of nicotine dependence: Secondary | ICD-10-CM

## 2024-06-12 DIAGNOSIS — E66812 Obesity, class 2: Secondary | ICD-10-CM

## 2024-06-12 DIAGNOSIS — Z6835 Body mass index (BMI) 35.0-35.9, adult: Secondary | ICD-10-CM

## 2024-06-12 DIAGNOSIS — R7309 Other abnormal glucose: Secondary | ICD-10-CM

## 2024-06-12 MED ORDER — METHOCARBAMOL 750 MG PO TABS: 750.0000 mg | ORAL_TABLET | Freq: Four times a day (QID) | ORAL | 3 refills | Status: DC | PRN

## 2024-06-12 MED ORDER — LORAZEPAM 0.5 MG PO TABS: 0.5000 mg | ORAL_TABLET | Freq: Three times a day (TID) | ORAL | 0 refills | Status: DC | PRN

## 2024-06-12 MED ORDER — BREZTRI AEROSPHERE 160-9-4.8 MCG/ACT IN AERO: 2.0000 | INHALATION_SPRAY | Freq: Two times a day (BID) | RESPIRATORY_TRACT | 11 refills | Status: DC

## 2024-06-12 NOTE — Patient Instructions (Addendum)
 Smoking Cessation: QuitlineNC 1-800-QUIT-NOW 904-676-3687); Espaol: 1-855-Djelo-Ya (8-144-664-6430) http://carroll-castaneda.info/      MyChart:  For all urgent or time sensitive needs we ask that you please call the office to avoid delays. Our number is [phone]) [phone]. MyChart is not constantly monitored and due to the large volume of messages a day, replies may take up to 72 business hours.   MyChart Policy: MyChart allows for you to see your visit notes, after visit summary, provider recommendations, lab and tests results, make an appointment, request refills, and contact your provider or the office for non-urgent questions or concerns. Providers are seeing patients during normal business hours and do not have built in time to review MyChart messages.  We ask that you allow a minimum of 3 business days for responses to [employer]. For this reason, please do not send urgent requests through MyChart. Please call the office at [phone]. New and ongoing conditions may require a visit. We have virtual and in person visit available for your convenience.  Complex MyChart concerns may require a visit. Your provider may request you schedule a virtual or in person visit to ensure we are providing the best care possible. MyChart messages sent after 11:00 AM on Friday will not be received by the provider until Monday morning.    Lab and Test Results: You will receive your lab and test results on MyChart as soon as they are completed and results have been sent by the lab or testing facility. Due to this service, you will receive your results BEFORE your provider.  I review lab and tests results each morning prior to seeing patients. Some results require collaboration with other providers to ensure you are receiving the most appropriate care. For this reason, we ask that you please allow a minimum of 3-5 business days from the time the ALL results have been received for your provider to  receive and review lab and test results and contact you about these.  Most lab and test result comments from the provider will be sent through MyChart. Your provider may recommend changes to the plan of care, follow-up visits, repeat testing, ask questions, or request an office visit to discuss these results. You may reply directly to this message or call the office at [phone] to provide information for the provider or set up an appointment. In some instances, you will be called with test results and recommendations. Please let us  know if this is preferred and we will make note of this in your chart to provide this for you.    If you have not heard a response to your lab or test results in 5 business days from all results returning to MyChart, please call the office to let us  know. We ask that you please avoid calling prior to this time unless there is an emergent concern. Due to high call volumes, this can delay the resulting process.   After Hours: For all non-emergency after hours needs, please call the office at [phone] and select the option to reach the on-call provider service. On-call services are shared between multiple [facility] offices and therefore it will not be possible to speak directly with your provider. On-call providers may provide medical advice and recommendations, but are unable to provide refills for maintenance medications.  For all emergency or urgent medical needs after normal business hours, we recommend that you seek care at the closest Urgent Care or Emergency Department to ensure appropriate treatment in a timely manner.  [facility] has a 24 hour emergency room located on the ground floor for your convenience.    Urgent Concerns During the Business Day Providers are seeing patients from 8AM to 5PM, Monday through Thursday, and 8AM to 12PM on Friday with a busy schedule and are most often not able to respond to non-urgent calls until the end  of the day or the next business day. If you should have URGENT concerns during the day, please call and speak to the nurse or schedule a same day appointment so that we can address your concern without delay.    Thank you, again, for choosing me as your health care partner. I appreciate your trust and look forward to learning more about you.    Veronica Arts, FNP-C

## 2024-06-12 NOTE — Progress Notes (Signed)
 Acute Care Office Visit  Subjective:   Veronica Rangel 1966/07/19 06/12/2024  Chief Complaint  Patient presents with   Anxiety   Shortness of Breath   Dizziness   HPI: Presents today for an acute visit with complaint of anxiety. She has a complex psychiatric and medical history.  She reports chronic shortness of breath and chronic body pain. She reports she has been taking Robaxin  as prescribed Q6 hours PRN, Ativan  0.5mg  TID PRN, Effexor  225mg  daily, and propranolol  10mg  BID. She reports that she is no longer smoking- she does vape occasionally.      06/12/2024    2:06 PM 04/05/2024    2:47 PM 03/06/2024    1:31 PM 01/24/2024    1:27 PM  GAD 7 : Generalized Anxiety Score  Nervous, Anxious, on Edge 1 3 3 3   Control/stop worrying 2 3 3 3   Worry too much - different things 2 3 3 3   Trouble relaxing 2 3 3 2   Restless 0 0 0 2  Easily annoyed or irritable 1 1 1 1   Afraid - awful might happen 2 3 3 2   Total GAD 7 Score 10 16 16 16   Anxiety Difficulty  Very difficult Extremely difficult Not difficult at all     The following portions of the patient's history were reviewed and updated as appropriate: past medical history, past surgical history, family history, social history, allergies, medications, and problem list.   Patient Active Problem List   Diagnosis Date Noted   ILD (interstitial lung disease) (HCC) 05/14/2024   Multiple joint pain 03/06/2024   Spinal stenosis of lumbar region 03/06/2024   Rheumatoid lung disease (HCC) 02/08/2024   Back problem 01/24/2024   Drug-induced constipation 01/24/2024   Enthesopathy of hip region 01/24/2024   Lumbar spondylosis 01/24/2024   Muscle weakness 01/24/2024   Pain in thoracic spine 01/24/2024   Skin sensation disturbance 01/24/2024   Fibromyositis 01/24/2024   Incoordination 01/24/2024   Low back pain 01/24/2024   Panic anxiety syndrome 01/24/2024   Chronic obstructive pulmonary disease (HCC) 01/24/2024   Elevated blood pressure  reading in office without diagnosis of hypertension 10/12/2023   Prediabetes 03/10/2023   Gastroesophageal reflux disease 03/10/2023   Shortness of breath 05/27/2022   Depression, major, single episode, moderate (HCC) 05/07/2022   Fibromyalgia 05/07/2022   Rheumatoid arthritis involving both hands (HCC) 05/07/2022   History of kidney cancer 05/07/2022   Tobacco use 05/07/2022   Anxiety disorder 05/05/2022   COPD 0/ active smoker with ? 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   Pain in both feet 06/15/2017   Encounter for screening for respiratory tuberculosis 06/15/2017   Raised antibody titer 06/15/2017   Epigastric pain 12/25/2013   Chronic narcotic dependence (HCC) 12/25/2013   Renal cell carcinoma (HCC) 12/25/2013   Shoulder pain 06/09/2013   Depression 10/26/2012   Loss of appetite 10/26/2012   Past Medical History:  Diagnosis Date   Anxiety    Cancer (HCC)    renal   COPD (chronic obstructive pulmonary disease) (HCC)    Depression    Encounter for screening for respiratory tuberculosis 06/15/2017   Fibromyalgia    GERD (gastroesophageal reflux disease)    Hypertension    RA (rheumatoid arthritis) (HCC)    Shingles    Past Surgical History:  Procedure Laterality Date   ADRENALECTOMY  Left    APPENDECTOMY     CESAREAN SECTION     CHOLECYSTECTOMY     NEPHRECTOMY RADICAL     TUBAL LIGATION     Family History  Problem Relation Age of Onset   Cancer Father        lung   Heart disease Maternal Grandfather    Diabetes Maternal Grandfather    Outpatient Medications Prior to Visit  Medication Sig Dispense Refill   albuterol  (VENTOLIN  HFA) 108 (90 Base) MCG/ACT inhaler Inhale 2 puffs into the lungs every 6 (six) hours as needed for wheezing or shortness of breath. 8 g 3   ipratropium-albuterol   (DUONEB) 0.5-2.5 (3) MG/3ML SOLN Take 3 mLs by nebulization every 4 (four) hours as needed. 90 mL 5   propranolol  (INDERAL ) 10 MG tablet Take 1 tablet (10 mg total) by mouth 2 (two) times daily. 60 tablet 2   venlafaxine  XR (EFFEXOR  XR) 75 MG 24 hr capsule Take 1 capsule (75 mg total) by mouth daily with breakfast. Take with venlafaxine  150mg  to equal 225mg  daily 30 capsule 2   venlafaxine  XR (EFFEXOR -XR) 150 MG 24 hr capsule Take 1 capsule (150 mg total) by mouth daily with breakfast. Take with venlafaxine  75mg  to equal venlafaxine  225mg  daily 30 capsule 2   LORazepam  (ATIVAN ) 0.5 MG tablet Take 1 tablet (0.5 mg total) by mouth 3 (three) times daily as needed for anxiety. 90 tablet 0   methocarbamol  (ROBAXIN ) 500 MG tablet Take 1 tablet (500 mg total) by mouth every 6 (six) hours as needed for muscle spasms. 30 tablet 2   nicotine  polacrilex (COMMIT) 2 MG lozenge Take 1 lozenge (2 mg total) by mouth as needed for smoking cessation. 100 tablet 1   No facility-administered medications prior to visit.   Allergies  Allergen Reactions   Codeine Anaphylaxis and Other (See Comments)   Amoxicillin-Pot Clavulanate Rash   Prednisone  Anxiety    PO    ROS: A complete ROS was performed with pertinent positives/negatives noted in the HPI. The remainder of the ROS are negative.    Objective:   Today's Vitals   06/12/24 1350 06/12/24 1410  BP: (!) 139/93 124/84  Pulse: (!) 103   Resp: 16   SpO2: 96%   Weight: 206 lb (93.4 kg)   Height: 5' 4 (1.626 m)   PainSc: 0-No pain     GENERAL: Well-appearing, in NAD. Well nourished.  SKIN: Pink, warm and dry. No rash, lesion, ulceration, or ecchymoses.  Head: Normocephalic. NECK: Trachea midline. Full ROM w/o pain or tenderness. No lymphadenopathy.  EARS: Tympanic membranes are intact, translucent without bulging and without drainage. Appropriate landmarks visualized.  EYES: Conjunctiva clear without exudates. EOMI, PERRL, no drainage present.  NOSE:  Septum midline w/o deformity. Nares patent, mucosa pink and non-inflamed w/o drainage. No sinus tenderness.  THROAT: Uvula midline. Oropharynx clear. Tonsils non-inflamed without exudate. Mucous membranes pink and moist.  RESPIRATORY: Chest wall symmetrical. Respirations even and non-labored. Breath sounds clear to auscultation bilaterally.  CARDIAC: S1, S2 present, regular rate and rhythm without murmur or gallops. Peripheral pulses 2+ bilaterally.  MSK: Muscle tone and strength appropriate for age. Joints w/o tenderness, redness, or swelling.  EXTREMITIES: Without clubbing, cyanosis, or edema.  NEUROLOGIC: No motor or sensory deficits. Steady, even gait. C2-C12 intact.  PSYCH/MENTAL STATUS: Alert, oriented x 3. Cooperative, appropriate mood and affect.    No results found for any visits on 06/12/24.    Assessment & Plan:   1. Generalized anxiety disorder  GAD7 completed with score of 10. She reports daily panic attachs, with shortness of breath, difficulty breathing and dizziness. Denies chest pain, palpitations, and hyperventilation. Discussed benefits of cognitive behavioral therapy (CBT) and first-line pharmacotherapy concurrently. Patient is not interested in counseling and would also like to proceed with current medication regimen at this time. PDMP reviewed, no red flags present. Rx sent to pharmacy on file.  - LORazepam  (ATIVAN ) 0.5 MG tablet; Take 1 tablet (0.5 mg total) by mouth 3 (three) times daily as needed for anxiety.  Dispense: 90 tablet; Refill: 0  2. Fibromyalgia Patient has upcoming appointment with rheumatology.  - methocarbamol  (ROBAXIN ) 750 MG tablet; Take 1 tablet (750 mg total) by mouth every 6 (six) hours as needed for muscle spasms.  Dispense: 90 tablet; Refill: 3 - CBC with Differential/Platelet - C-reactive protein - Cyclic citrul peptide antibody, IgG - ANA - Rheumatoid factor - Sedimentation rate - Comprehensive metabolic panel with GFR - CYCLIC CITRUL  PEPTIDE ANTIBODY, IGG/IGA  3. Lung nodule (Primary) Incidental 3mm nodule noted in the right lower lobe in 12/2023. Since patient is high-risk for lung cancer, due to her smoking history, will order CT chest. Patient agreeable to complete this.  - CT CHEST WO CONTRAST; Future  4. ILD (interstitial lung disease) (HCC) CT chest from 12/2023 shows stable peripheral intersistial and ground-glass opacities in the bilateral upper lobes, most likely related to chronic intersitital lung disease. Vital signs stable. Oxygen saturation is normal. Normal respiratory rate without use of accessory muscles or tachypnea. Will get patient on triple therapy inhaler to help with ILD and history of COPD- in order to help relieve her sensation of shortness of breath.  - budesonide -glycopyrrolate-formoterol  (BREZTRI  AEROSPHERE) 160-9-4.8 MCG/ACT AERO inhaler; Inhale 2 puffs into the lungs 2 (two) times daily.  Dispense: 10.7 g; Refill: 11 - CT CHEST WO CONTRAST; Future  5. Personal history of nicotine  dependence Patient would qualify for lung cancer screening due to history of smoking. Order for chest CT placed.  - CT CHEST WO CONTRAST; Future  6. Rheumatoid arthritis involving multiple sites with positive rheumatoid factor [facility]) Patient unable to see rheumatology until 11/26/2024. Will complete full panel for RA.  - CBC with Differential/Platelet - C-reactive protein - Cyclic citrul peptide antibody, IgG - ANA - Rheumatoid factor - Sedimentation rate - Comprehensive metabolic panel with GFR  7. Class 2 severe obesity due to excess calories with serious comorbidity and body mass index (BMI) of 35.0 to 35.9 in adult [facility]) Will update hemoglobin A1c today. Patient is not fasting but would benefit from future fasting lipid panel.  - Hemoglobin A1c  8. Elevated glucose Last A1c checked in 09/2023 with results of 5.6%. Will updated hemoglobin A1c today.  - Hemoglobin A1c   Meds ordered this encounter   Medications   LORazepam  (ATIVAN ) 0.5 MG tablet    Sig: Take 1 tablet (0.5 mg total) by mouth 3 (three) times daily as needed for anxiety.    Dispense:  90 tablet    Refill:  0    Supervising Provider:   ZIGLAR, SUSAN K [[id number]]   budesonide -glycopyrrolate-formoterol  (BREZTRI  AEROSPHERE) 160-9-4.8 MCG/ACT AERO inhaler    Sig: Inhale 2 puffs into the lungs 2 (two) times daily.    Dispense:  10.7 g    Refill:  11    Supervising Provider:   ZIGLAR, SUSAN K [[id number]]   methocarbamol  (ROBAXIN ) 750 MG tablet    Sig: Take 1 tablet (750 mg total) by mouth every 6 (six) hours  as needed for muscle spasms.    Dispense:  90 tablet    Refill:  3    Supervising Provider:   ZIGLAR, SUSAN K [[id number]]   Lab Orders         CBC with Differential/Platelet         C-reactive protein         Cyclic citrul peptide antibody, IgG         ANA         Rheumatoid factor         Sedimentation rate         Comprehensive metabolic panel with GFR         Hemoglobin A1c         CYCLIC CITRUL PEPTIDE ANTIBODY, IGG/IGA     No images are attached to the encounter or orders placed in the encounter.  Return in about 4 weeks (around 07/10/2024) for chronic conditions .    Patient to reach out to office if new, worrisome, or unresolved symptoms arise or if no improvement in patient's condition. Patient verbalized understanding and is agreeable to treatment plan. All questions answered to patient's satisfaction.    Evalene Arts, FNP

## 2024-06-13 LAB — CBC WITH DIFFERENTIAL/PLATELET
Basophils Absolute: 0.1 x10E3/uL (ref 0.0–0.2)
Basos: 1 %
EOS (ABSOLUTE): 0.2 x10E3/uL (ref 0.0–0.4)
Eos: 2 %
Hematocrit: 42.9 % (ref 34.0–46.6)
Hemoglobin: 14 g/dL (ref 11.1–15.9)
Immature Grans (Abs): 0 x10E3/uL (ref 0.0–0.1)
Immature Granulocytes: 0 %
Lymphocytes Absolute: 3.9 x10E3/uL — ABNORMAL HIGH (ref 0.7–3.1)
Lymphs: 37 %
MCH: 27.8 pg (ref 26.6–33.0)
MCHC: 32.6 g/dL (ref 31.5–35.7)
MCV: 85 fL (ref 79–97)
Monocytes Absolute: 0.7 x10E3/uL (ref 0.1–0.9)
Monocytes: 7 %
Neutrophils Absolute: 5.6 x10E3/uL (ref 1.4–7.0)
Neutrophils: 53 %
Platelets: 400 x10E3/uL (ref 150–450)
RBC: 5.04 x10E6/uL (ref 3.77–5.28)
RDW: 14.6 % (ref 11.7–15.4)
WBC: 10.5 x10E3/uL (ref 3.4–10.8)

## 2024-06-13 LAB — SEDIMENTATION RATE: Sed Rate: 63 mm/h — ABNORMAL HIGH (ref 0–40)

## 2024-06-13 LAB — C-REACTIVE PROTEIN: CRP: 14 mg/L — ABNORMAL HIGH (ref 0–10)

## 2024-06-13 LAB — COMPREHENSIVE METABOLIC PANEL WITH GFR
ALT: 16 IU/L (ref 0–32)
AST: 16 IU/L (ref 0–40)
Albumin: 4.1 g/dL (ref 3.8–4.9)
Alkaline Phosphatase: 113 IU/L (ref 44–121)
BUN/Creatinine Ratio: 13 (ref 12–28)
BUN: 11 mg/dL (ref 8–27)
Bilirubin Total: <0.2 mg/dL (ref 0.0–1.2)
CO2: 20 mmol/L (ref 20–29)
Calcium: 9.4 mg/dL (ref 8.7–10.3)
Chloride: 100 mmol/L (ref 96–106)
Creatinine, Ser: 0.86 mg/dL (ref 0.57–1.00)
Globulin, Total: 2.9 g/dL (ref 1.5–4.5)
Glucose: 154 mg/dL — ABNORMAL HIGH (ref 70–99)
Potassium: 4.7 mmol/L (ref 3.5–5.2)
Sodium: 137 mmol/L (ref 134–144)
Total Protein: 7 g/dL (ref 6.0–8.5)
eGFR: 77 mL/min/1.73 (ref >59)

## 2024-06-13 LAB — RHEUMATOID FACTOR: Rheumatoid fact SerPl-aCnc: 426.7 [IU]/mL — ABNORMAL HIGH (ref <14.0)

## 2024-06-13 LAB — CYCLIC CITRUL PEPTIDE ANTIBODY, IGG/IGA: Cyclic Citrullin Peptide Ab: >250 U — ABNORMAL HIGH (ref 0–19)

## 2024-06-13 LAB — ANA: Anti Nuclear Antibody (ANA): NEGATIVE

## 2024-06-13 LAB — HEMOGLOBIN A1C
Est. average glucose Bld gHb Est-mCnc: 123 mg/dL
Hgb A1c MFr Bld: 5.9 % — ABNORMAL HIGH (ref 4.8–5.6)

## 2024-06-14 ENCOUNTER — Inpatient Hospital Stay: Admission: RE | Admit: 2024-06-14 | Source: Ambulatory Visit

## 2024-06-14 ENCOUNTER — Other Ambulatory Visit: Payer: Self-pay | Admitting: Nurse Practitioner

## 2024-06-18 ENCOUNTER — Telehealth: Payer: Self-pay

## 2024-06-18 NOTE — Telephone Encounter (Signed)
 Attempted to contact patient to schedule AWV. Left message to call the office to schedule this telephone visit.

## 2024-06-20 ENCOUNTER — Other Ambulatory Visit

## 2024-06-25 ENCOUNTER — Ambulatory Visit: Payer: Self-pay | Admitting: Family Medicine

## 2024-06-27 ENCOUNTER — Encounter: Payer: Self-pay | Admitting: Family Medicine

## 2024-06-27 DIAGNOSIS — F410 Panic disorder [episodic paroxysmal anxiety] without agoraphobia: Secondary | ICD-10-CM

## 2024-06-27 DIAGNOSIS — F411 Generalized anxiety disorder: Secondary | ICD-10-CM

## 2024-06-27 MED ORDER — PROPRANOLOL HCL 10 MG PO TABS: 10.0000 mg | ORAL_TABLET | Freq: Two times a day (BID) | ORAL | 2 refills | Status: DC

## 2024-07-10 ENCOUNTER — Ambulatory Visit: Admitting: Family Medicine

## 2024-07-12 ENCOUNTER — Telehealth (INDEPENDENT_AMBULATORY_CARE_PROVIDER_SITE_OTHER): Admitting: Family Medicine

## 2024-07-12 ENCOUNTER — Encounter: Payer: Self-pay | Admitting: Family Medicine

## 2024-07-12 DIAGNOSIS — M25512 Pain in left shoulder: Secondary | ICD-10-CM | POA: Diagnosis not present

## 2024-07-12 DIAGNOSIS — F411 Generalized anxiety disorder: Secondary | ICD-10-CM

## 2024-07-12 DIAGNOSIS — G8929 Other chronic pain: Secondary | ICD-10-CM

## 2024-07-12 MED ORDER — LORAZEPAM 0.5 MG PO TABS: 0.5000 mg | ORAL_TABLET | Freq: Three times a day (TID) | ORAL | 0 refills | Status: AC | PRN

## 2024-07-12 NOTE — Progress Notes (Signed)
 Virtual Visit via Video Note  I connected with Veronica Rangel on 07/12/24 at 2:25PM by a video enabled telemedicine application and verified that I am speaking with the correct person using two identifiers.  Patient Location: Home Provider Location: Office/Clinic  I discussed the limitations, risks, security, and privacy concerns of performing an evaluation and management service by video and the availability of in person appointments. I also discussed with the patient that there may be a patient responsible charge related to this service. The patient expressed understanding and agreed to proceed.  Subjective: PCP: Towana Small, FNP  Chief Complaint  Patient presents with   Telehealth Consent   Discussed the use of AI scribe software for clinical note transcription with the patient, who gave verbal consent to proceed.  History of Present Illness    Veronica Rangel is a pleasant 58 year old female who presents today for chronic medication management. She is having a lot of issues with with RA.   She experiences difficulty hearing during the virtual consultation, stating 'I can barely hear you' and 'I can't hardly hear you'. She has adjusted her audio settings to maximum but continues to have issues with hearing the conversation. She reports her pain is so bad that she has been unable to leave the house. Her left shoulder is bothering her, reporting that she cannot get it over her head- which she has mentioned at a previous visit.     ROS: Per HPI  Current Outpatient Medications:    albuterol  (VENTOLIN  HFA) 108 (90 Base) MCG/ACT inhaler, Inhale 2 puffs into the lungs every 6 (six) hours as needed for wheezing or shortness of breath., Disp: 8 g, Rfl: 3   budesonide -glycopyrrolate-formoterol  (BREZTRI  AEROSPHERE) 160-9-4.8 MCG/ACT AERO inhaler, Inhale 2 puffs into the lungs 2 (two) times daily., Disp: 10.7 g, Rfl: 11   ipratropium-albuterol  (DUONEB) 0.5-2.5 (3) MG/3ML SOLN, Take 3 mLs by  nebulization every 4 (four) hours as needed., Disp: 90 mL, Rfl: 5   methocarbamol  (ROBAXIN ) 750 MG tablet, Take 1 tablet (750 mg total) by mouth every 6 (six) hours as needed for muscle spasms., Disp: 90 tablet, Rfl: 3   propranolol  (INDERAL ) 10 MG tablet, Take 1 tablet (10 mg total) by mouth 2 (two) times daily., Disp: 60 tablet, Rfl: 2   venlafaxine  XR (EFFEXOR -XR) 150 MG 24 hr capsule, Take 1 capsule (150 mg total) by mouth daily with breakfast. Take with venlafaxine  75mg  to equal venlafaxine  225mg  daily, Disp: 30 capsule, Rfl: 2   LORazepam  (ATIVAN ) 0.5 MG tablet, Take 1 tablet (0.5 mg total) by mouth 3 (three) times daily as needed for anxiety., Disp: 90 tablet, Rfl: 0  Observations/Objective: Today's Vitals   07/12/24 1405  PainSc: 0-No pain    General: Alert and oriented x 4. Speaking in clear and full sentences, no audible heavy breathing, no acute distress.  Sounds alert and appropriately interactive.  Appears well.  Face symmetric.  Extraocular movements intact.  Pupils equal and round.  No nasal flaring or accessory muscle use visualized.  Assessment and Plan: 1. Generalized anxiety disorder (Primary) Discussed weaning off Effexor  to Cymbalta . Advised patient to reduce Effexor  on week 1 from 225mg /day to 150mg /day and start Cymbalta  20mg  daily. On week 2, decrease Effexor  to 75mg /day and continue with Cymbalta . On week 3, patient with discontinue Effexor  completely and continue taking Cymbalta  20mg  daily. Discussed that we could increase Cymbalta  once she was off the Effexor . Discussed with patient that this may help improve her chronic pain and mood. Patient  requesting refill of her Ativan - PDMP reviewed, no red flags present. Rx sent to pharmacy on file.  - LORazepam  (ATIVAN ) 0.5 MG tablet; Take 1 tablet (0.5 mg total) by mouth 3 (three) times daily as needed for anxiety.  Dispense: 90 tablet; Refill: 0  2. Chronic left shoulder pain Most likely due to chronic RA. Patient has  mentioned difficulty in raising her left arm due to pain. Will obtain x-ray at this time. May need to follow-up with ortho. Patient has seen [facility] in the past.  - DG Shoulder Left; Future   Follow Up Instructions: Return in about 4 weeks (around 08/09/2024) for Mood f/u.   I discussed the assessment and treatment plan with the patient. The patient was provided an opportunity to ask questions, and all were answered. The patient agreed with the plan and demonstrated an understanding of the instructions.   The patient was advised to call back or seek an in-person evaluation if the symptoms worsen or if the condition fails to improve as anticipated.  The above assessment and management plan was discussed with the patient. The patient verbalized understanding of and has agreed to the management plan.   Evalene Arts, FNP

## 2024-07-27 ENCOUNTER — Encounter: Payer: Self-pay | Admitting: Family Medicine

## 2024-08-06 ENCOUNTER — Other Ambulatory Visit: Payer: Self-pay | Admitting: Family Medicine

## 2024-08-06 DIAGNOSIS — F411 Generalized anxiety disorder: Secondary | ICD-10-CM

## 2024-08-06 MED ORDER — VENLAFAXINE HCL ER 75 MG PO CP24: ORAL_CAPSULE | ORAL | 3 refills | Status: AC

## 2024-08-06 MED ORDER — VENLAFAXINE HCL ER 150 MG PO CP24: 150.0000 mg | ORAL_CAPSULE | Freq: Every day | ORAL | 3 refills | Status: AC

## 2024-08-13 ENCOUNTER — Encounter: Payer: Self-pay | Admitting: Family Medicine

## 2024-08-13 ENCOUNTER — Other Ambulatory Visit: Payer: Self-pay | Admitting: Family Medicine

## 2024-08-13 DIAGNOSIS — F411 Generalized anxiety disorder: Secondary | ICD-10-CM

## 2024-08-13 MED ORDER — LORAZEPAM 0.5 MG PO TABS: 0.5000 mg | ORAL_TABLET | Freq: Three times a day (TID) | ORAL | 0 refills | Status: DC | PRN

## 2024-08-23 ENCOUNTER — Ambulatory Visit: Admitting: Family Medicine

## 2024-08-30 ENCOUNTER — Ambulatory Visit: Admitting: Family Medicine

## 2024-09-05 ENCOUNTER — Ambulatory Visit: Admitting: Family Medicine

## 2024-09-05 VITALS — BP 118/81 | HR 88 | Resp 16 | Ht 64.0 in | Wt 214.4 lb

## 2024-09-05 DIAGNOSIS — J849 Interstitial pulmonary disease, unspecified: Secondary | ICD-10-CM

## 2024-09-05 DIAGNOSIS — F419 Anxiety disorder, unspecified: Secondary | ICD-10-CM

## 2024-09-05 DIAGNOSIS — F331 Major depressive disorder, recurrent, moderate: Secondary | ICD-10-CM

## 2024-09-05 DIAGNOSIS — M064 Inflammatory polyarthropathy: Secondary | ICD-10-CM

## 2024-09-05 DIAGNOSIS — M62838 Other muscle spasm: Secondary | ICD-10-CM

## 2024-09-05 MED ORDER — TIZANIDINE HCL 2 MG PO TABS: 2.0000 mg | ORAL_TABLET | Freq: Four times a day (QID) | ORAL | 0 refills | Status: DC | PRN

## 2024-09-05 MED ORDER — BUDESONIDE-FORMOTEROL FUMARATE 80-4.5 MCG/ACT IN AERO
2.0000 | INHALATION_SPRAY | Freq: Two times a day (BID) | RESPIRATORY_TRACT | 2 refills | Status: AC
Start: 2024-09-05 — End: ?

## 2024-09-05 NOTE — Progress Notes (Unsigned)
 Established Patient Office Visit  Subjective  Patient ID: Veronica Rangel, female    DOB: Jun 26, 1966  Age: 58 y.o. MRN: [id number]  Chief Complaint  Patient presents with   Anxiety   Interstitial Lung Disease   Discussed the use of AI scribe software for clinical note transcription with the patient, who gave verbal consent to proceed.  History of Present Illness   Veronica Rangel is a 58 year old female with chronic pain and fibromyalgia who presents with worsening pain and difficulty breathing.  She experiences severe, widespread pain throughout her body, describing it as 'everything hurts' and 'I can't take this pain no more.' The pain is particularly intense in her spine and chest, causing a sensation of her spine moving and her chest caving in. She also experiences tingling, prickling, and spasming sensations, especially in her chest and throat, which she believes may be related to her lungs.   She has been using Robaxin  every six hours, but finds it ineffective. She has tried various topical treatments like Icy Hot and lidocaine patches, which provide minimal relief. She uses an ice pack frequently for temporary pain management. She has previously tried tizanidine, which made her sleepy, but was somewhat effective.  She experiences shortness of breath and wheezing, particularly with movement. She resumed using an albuterol  inhaler about two weeks ago, which provides some relief. She has been unable to afford a prescribed inhaler, Breztri , due to its high cost.  She reports significant weakness, stating she can barely hold herself up and struggles to walk. She was escorted into the clinic today with our wheelchair. She has been largely bedridden, requiring assistance from her husband for daily activities. She mentions difficulty with weight management, having gained weight despite eating only one meal a day.   She experiences dryness in her mouth and throat, and her husband assists her with  hydration. She has been feeling dizzy and has been checking her blood pressure at home before taking her propranolol . She reports due to dehydration, her blood pressures have been lower than normal.   She has a history of arthritis and expresses a need for medication to manage her symptoms. She has tried steroids in the past but reports they make her angry and severely impact her anxiety. She is hesitant to change her current medications, including switching from Effexor  to Cymbalta , due to past experiences with medication changes.      06/12/2024    2:06 PM 04/05/2024    2:47 PM 03/06/2024    1:31 PM 01/24/2024    1:27 PM  GAD 7 : Generalized Anxiety Score  Nervous, Anxious, on Edge 1 3 3 3   Control/stop worrying 2 3 3 3   Worry too much - different things 2 3 3 3   Trouble relaxing 2 3 3 2   Restless 0 0 0 2  Easily annoyed or irritable 1 1 1 1   Afraid - awful might happen 2 3 3 2   Total GAD 7 Score 10 16 16 16   Anxiety Difficulty  Very difficult Extremely difficult Not difficult at all       06/12/2024    2:04 PM 04/05/2024    2:47 PM 03/06/2024    1:29 PM  PHQ9 SCORE ONLY  PHQ-9 Total Score 14  12  15       Data saved with a previous flowsheet row definition   ROS: see HPI     Objective:    BP 118/81   Pulse 88   Resp 16  Ht 5' 4 (1.626 m)   Wt 214 lb 6.4 oz (97.3 kg)   SpO2 96%   BMI 36.80 kg/m  BP Readings from Last 3 Encounters:  09/05/24 118/81  06/12/24 124/84  05/14/24 124/60    Physical Exam Vitals reviewed.  Constitutional:      Appearance: Normal appearance.  Cardiovascular:     Rate and Rhythm: Normal rate and regular rhythm.     Pulses: Normal pulses.     Heart sounds: Normal heart sounds.  Pulmonary:     Effort: Pulmonary effort is normal.     Breath sounds: Normal breath sounds. No wheezing.  Musculoskeletal:     Right hand: Deformity and tenderness present. Decreased range of motion. Decreased strength.     Left hand: Deformity and tenderness  present. Decreased range of motion. Decreased strength.  Neurological:     Mental Status: She is alert.  Psychiatric:        Mood and Affect: Mood normal. Affect is tearful.        Behavior: Behavior normal.      Assessment & Plan:   1. Anxiety disorder with panic attacks (Primary) GAD7 completed with score of 10. Reports frequent panic attacks, shortness of breath, difficulty breathing, and dizziness. Discussed benefits of cognitive behavioral therapy (CBT) and first-line pharmacotherapy concurrently. Continue current medication reigmen- Effexor  225mg  daily and Ativan  0.5mg  Q8 hours PRN. No refills needed at this time.  2. Muscle spasm Fibromyalgia and chronic pain syndrome. Chronic pain with muscle spasms and tingling, likely fibromyalgia-related. Robaxin  ineffective. Tizanidine previously beneficial. Severe pain impacts daily function. Patient is willing to change medication regimen, since she has trialed this before. Discontinue Robaxin . Prescribe tizanidine 2 mg every 6 hours as needed for muscle spasms. Educated on potential sedation with tizanidine, especially with Ativan . Advised to take Ativan  and tizanidine at least two hours apart. Attempt to expedite rheumatology consultation. - tiZANidine (ZANAFLEX) 2 MG tablet; Take 1 tablet (2 mg total) by mouth every 6 (six) hours as needed for muscle spasms.  Dispense: 30 tablet; Refill: 0  3. ILD (interstitial lung disease) (HCC) Intermittent wheezing and shortness of breath. Albuterol  provides some relief. Breztri  unaffordable. Symbicort  may be an affordable alternative. Prescribe Symbicort , two puffs twice a day. Continue albuterol  inhaler as needed. - budesonide -formoterol  (SYMBICORT ) 80-4.5 MCG/ACT inhaler; Inhale 2 puffs into the lungs 2 (two) times daily.  Dispense: 6.9 g; Refill: 2  4. Inflammatory polyarthropathy (HCC) Chronic joint pain and stiffness affecting mobility. Current pain management inadequate. Awaiting rheumatology  consultation. Attempt to expedite rheumatology consultation.  5. Moderate episode of recurrent major depressive disorder (HCC) Major depressive disorder. PHQ9 completed with score of 14. Denies active or passive suicidal ideations.  Depressive symptoms worsened by chronic pain and limitations. Cymbalta  provided limited relief. Concerns about changing medications now. Continue current antidepressant regimen- Effexor  225mg  daily. Reassess need for medication change at future appointment.   Return in about 4 weeks (around 10/03/2024) for chronic pain & mood .    Evalene Arts, FNP

## 2024-09-11 ENCOUNTER — Other Ambulatory Visit: Payer: Self-pay | Admitting: Family Medicine

## 2024-09-11 ENCOUNTER — Encounter: Payer: Self-pay | Admitting: Family Medicine

## 2024-09-11 DIAGNOSIS — F410 Panic disorder [episodic paroxysmal anxiety] without agoraphobia: Secondary | ICD-10-CM

## 2024-09-11 DIAGNOSIS — F411 Generalized anxiety disorder: Secondary | ICD-10-CM

## 2024-09-11 MED ORDER — LORAZEPAM 0.5 MG PO TABS: 0.5000 mg | ORAL_TABLET | Freq: Two times a day (BID) | ORAL | 0 refills | Status: DC | PRN

## 2024-09-21 ENCOUNTER — Encounter: Payer: Self-pay | Admitting: Family Medicine

## 2024-09-21 DIAGNOSIS — F411 Generalized anxiety disorder: Secondary | ICD-10-CM

## 2024-09-21 DIAGNOSIS — F410 Panic disorder [episodic paroxysmal anxiety] without agoraphobia: Secondary | ICD-10-CM

## 2024-09-22 ENCOUNTER — Other Ambulatory Visit: Payer: Self-pay | Admitting: Family Medicine

## 2024-09-22 DIAGNOSIS — F411 Generalized anxiety disorder: Secondary | ICD-10-CM

## 2024-09-22 DIAGNOSIS — F410 Panic disorder [episodic paroxysmal anxiety] without agoraphobia: Secondary | ICD-10-CM

## 2024-09-25 MED ORDER — MELOXICAM 15 MG PO TABS: 15.0000 mg | ORAL_TABLET | Freq: Every day | ORAL | 2 refills | Status: DC

## 2024-09-25 MED ORDER — PROPRANOLOL HCL 10 MG PO TABS: 10.0000 mg | ORAL_TABLET | Freq: Two times a day (BID) | ORAL | 2 refills | Status: AC

## 2024-09-25 NOTE — Addendum Note (Signed)
 Addended by: TOWANA SMALL on: 09/25/2024 10:07 AM   Modules accepted: Orders

## 2024-10-03 ENCOUNTER — Ambulatory Visit: Admitting: Family Medicine

## 2024-10-08 ENCOUNTER — Encounter: Payer: Self-pay | Admitting: Family Medicine

## 2024-10-10 ENCOUNTER — Ambulatory Visit: Admitting: Family Medicine

## 2024-10-11 ENCOUNTER — Other Ambulatory Visit: Payer: Self-pay | Admitting: Family Medicine

## 2024-10-11 DIAGNOSIS — F411 Generalized anxiety disorder: Secondary | ICD-10-CM

## 2024-10-11 MED ORDER — LORAZEPAM 0.5 MG PO TABS: 0.5000 mg | ORAL_TABLET | Freq: Two times a day (BID) | ORAL | 0 refills | Status: DC | PRN

## 2024-10-23 NOTE — Progress Notes (Unsigned)
 Office Visit Note  Patient: Veronica Rangel             Date of Birth: 11/13/1966           MRN: [id number]             PCP: Towana Small, FNP Referring: Towana Small, FNP Visit Date: 11/01/2024 Occupation: Data Unavailable  Subjective:  Pain in multiple joints  History of Present Illness: Veronica Rangel is a 58 y.o. female with rheumatoid arthritis.  Patient states that her symptoms of rheumatoid arthritis started in 2014.  She was seen by rheumatologist at [facility] who started her on leflunomide.  She states she took leflunomide for less than 6 months and then discontinued it as she was concerned about the side effects including malignancy.  She states her arthritis has been gradually getting worse.  She continues to have pain and discomfort in the lower back, shoulders, wrist, hands, right knee, both ankles and her feet.  She does not have mobility in her joints is good.  She has been under care of Dr. Darlean for many years.  She was diagnosed with COPD in the past.  She states recently they have been concerned about ILD.  Patient states that she was hospitalized and had a CT scan which showed ILD.  She states that she is unable to walk due to shortness of breath.  She is not on oxygen.  She gives history of fatigue, dry mouth, diarrhea and constipation.  She gives history of anxiety and depression.  There is family history of rheumatoid arthritis in her mother.  She is right-handed and is on disability due to anxiety and depression.  She is married, gravida 2, para 2.  She is accompanied by her husband today.  She never had preeclampsia or DVTs.  She does not drink alcohol.  She states she has quit smoking and had been vaping now.  She smoked 1-1/2 pack/day for 40 years.      Activities of Daily Living:  Patient reports morning stiffness for all day.  Patient Reports nocturnal pain.  Difficulty dressing/grooming: Reports Difficulty climbing stairs: Reports Difficulty getting out of  chair: Reports Difficulty using hands for taps, buttons, cutlery, and/or writing: Reports  Review of Systems  Constitutional:  Positive for fatigue.  HENT:  Positive for mouth dryness. Negative for mouth sores.   Eyes:  Negative for dryness.  Respiratory:  Positive for shortness of breath.   Cardiovascular:  Negative for chest pain and palpitations.  Gastrointestinal:  Positive for constipation, diarrhea and nausea. Negative for blood in stool.  Endocrine: Negative for increased urination.  Genitourinary:  Negative for involuntary urination.  Musculoskeletal:  Positive for joint pain, gait problem, joint pain, joint swelling, myalgias, muscle weakness, morning stiffness, muscle tenderness and myalgias.  Skin:  Positive for rash. Negative for color change and sensitivity to sunlight.  Allergic/Immunologic: Negative for susceptible to infections.  Neurological:  Negative for dizziness and headaches.  Hematological:  Negative for swollen glands.  Psychiatric/Behavioral:  Positive for sleep disturbance. Negative for depressed mood. The patient is nervous/anxious.     PMFS History:  Patient Active Problem List   Diagnosis Date Noted   ILD (interstitial lung disease) (HCC) 05/14/2024   Multiple joint pain 03/06/2024   Spinal stenosis of lumbar region 03/06/2024   Rheumatoid lung disease (HCC) 02/08/2024   Back problem 01/24/2024   Drug-induced constipation 01/24/2024   Enthesopathy of hip region 01/24/2024   Lumbar spondylosis 01/24/2024   Muscle weakness 01/24/2024  Pain in thoracic spine 01/24/2024   Skin sensation disturbance 01/24/2024   Fibromyositis 01/24/2024   Incoordination 01/24/2024   Low back pain 01/24/2024   Panic anxiety syndrome 01/24/2024   Chronic obstructive pulmonary disease (HCC) 01/24/2024   Elevated blood pressure reading in office without diagnosis of hypertension 10/12/2023   Prediabetes 03/10/2023   Gastroesophageal reflux disease 03/10/2023   Shortness  of breath 05/27/2022   Depression, major, single episode, moderate (HCC) 05/07/2022   Fibromyalgia 05/07/2022   Rheumatoid arthritis involving both hands (HCC) 05/07/2022   History of kidney cancer 05/07/2022   Tobacco use 05/07/2022   Anxiety disorder 05/05/2022   COPD 0/ active smoker with ? 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   Pain in both feet 06/15/2017   Encounter for screening for respiratory tuberculosis 06/15/2017   Raised antibody titer 06/15/2017   Epigastric pain 12/25/2013   Chronic narcotic dependence (HCC) 12/25/2013   Renal cell carcinoma (HCC) 12/25/2013   Shoulder pain 06/09/2013   Depression 10/26/2012   Loss of appetite 10/26/2012    Past Medical History:  Diagnosis Date   Anxiety    Cancer (HCC)    renal   COPD (chronic obstructive pulmonary disease) (HCC)    Depression    Encounter for screening for respiratory tuberculosis 06/15/2017   Fibromyalgia    GERD (gastroesophageal reflux disease)    Hypertension    RA (rheumatoid arthritis) (HCC)    Shingles     Family History  Problem Relation Age of Onset   Thyroid  disease Mother    Rheum arthritis Mother    Cancer Father        lung   Heart disease Maternal Grandfather    Diabetes Maternal Grandfather    Healthy Son    Healthy Son    Past Surgical History:  Procedure Laterality Date   ADRENALECTOMY Left    APPENDECTOMY     CESAREAN SECTION     CHOLECYSTECTOMY     NASAL SINUS SURGERY     x2   NEPHRECTOMY RADICAL     TUBAL LIGATION     Social History   Tobacco Use   Smoking status: Former    Types: Cigarettes    Passive exposure: Current   Smokeless tobacco: Never   Tobacco comments:    Reports she quit cigarette smoking about 04/11/2023    States she does participate in vaping   Vaping Use   Vaping status: Some  Days  Substance Use Topics   Alcohol use: Never   Drug use: Never   Social History   Social History Narrative   Not on file     Immunization History  Administered Date(s) Administered   Td 05/08/2019     Objective: Vital Signs: BP (!) 141/85 (BP Location: Left Arm, Patient Position: Sitting, Cuff Size: Large)   Pulse 94   Temp (!) 97.5 F (36.4 C)   Resp 18   Ht 5' 4 (1.626 m)   Wt 215 lb 12.8 oz (97.9 kg)   BMI 37.04 kg/m    Physical Exam Vitals and nursing note reviewed.  Constitutional:      Appearance: She is well-developed.  HENT:     Head: Normocephalic and atraumatic.  Eyes:     Conjunctiva/sclera: Conjunctivae normal.  Cardiovascular:     Rate and Rhythm: Normal rate and regular rhythm.  Heart sounds: Normal heart sounds.  Pulmonary:     Effort: Pulmonary effort is normal.     Breath sounds: Normal breath sounds.  Abdominal:     General: Bowel sounds are normal.     Palpations: Abdomen is soft.  Musculoskeletal:     Cervical back: Normal range of motion.  Lymphadenopathy:     Cervical: No cervical adenopathy.  Skin:    General: Skin is warm and dry.     Capillary Refill: Capillary refill takes less than 2 seconds.  Neurological:     Mental Status: She is alert and oriented to person, place, and time.  Psychiatric:        Behavior: Behavior normal.      Musculoskeletal Exam: Patient was examined in the wheelchair.  She had limited lateral rotation of the cervical spine.  Shoulder abduction was limited to 90 degrees.  Elbow joints were in good range of motion.  Wrist joints were in good range of motion with no synovitis.  She has synovial thickening and synovitis of her bilateral MCP joints with ulnar deviation.  There was no swelling over the PIP or DIP joints.  Hip joints could not be assessed in the wheelchair.  Range of motion of the knee joints could not be assessed.  There was no tenderness over ankles or MTPs.  CDAI Exam: CDAI Score: 32   Patient Global: 80 / 100; Provider Global: 80 / 100 Swollen: 6 ; Tender: 12  Joint Exam 11/01/2024      Right  Left  Glenohumeral   Tender   Tender  MCP 2  Swollen Tender  Swollen Tender  MCP 3  Swollen Tender  Swollen Tender  MCP 4  Swollen Tender  Swollen Tender  Knee   Tender   Tender  Ankle   Tender   Tender     Investigation: No additional findings.  Imaging: No results found.  Recent Labs: Lab Results  Component Value Date   WBC 10.5 06/12/2024   HGB 14.0 06/12/2024   PLT 400 06/12/2024   NA 137 06/12/2024   K 4.7 06/12/2024   CL 100 06/12/2024   CO2 20 06/12/2024   GLUCOSE 154 (H) 06/12/2024   BUN 11 06/12/2024   CREATININE 0.86 06/12/2024   BILITOT <0.2 06/12/2024   ALKPHOS 113 06/12/2024   AST 16 06/12/2024   ALT 16 06/12/2024   PROT 7.0 06/12/2024   ALBUMIN 4.1 06/12/2024   CALCIUM  9.4 06/12/2024   June 12, 2024 ANA negative, RF 426.7, anti-CCP> 250, sed rate 63, CRP 14, hemoglobin A1c 5.9, Speciality Comments: No specialty comments available.  Procedures:  No procedures performed Allergies: Codeine, Amoxicillin-pot clavulanate, and Prednisone    Assessment / Plan:     Visit Diagnoses: Rheumatoid arthritis involving multiple sites with positive rheumatoid factor (HCC) - Positive RF, positive anti-CCP, elevated sedimentation rate, negative ANA: Patient states she was diagnosed with rheumatoid arthritis in 2014 by rheumatologist at [facility].  She was given leflunomide which she took for less than 6 months and discontinued as she was concerned about cancer.  She has not taken any treatment for rheumatoid arthritis.  She states her joints have deteriorated over the years.  She has constant discomfort in her joints, hands, knees and her feet and her ankles.  She notices swelling in her hands.  I will obtain labs and x-rays today.   results.  Detail counts regarding rheumatoid arthritis was provided.  Different treatment options were discussed.  However the  treatment choice  will be made after we have results of high-resolution CT.  High risk medication use -in anticipation to start on immunosuppressive therapies I will obtain following labs.  Plan: CBC with Differential/Platelet, Comprehensive metabolic panel with GFR, Hepatitis B core antibody, IgM, Hepatitis B surface antigen, Hepatitis C antibody, QuantiFERON-TB Gold Plus, Serum protein electrophoresis with reflex, IgG, IgA, IgM, Thiopurine methyltransferase(tpmt)rbc  Chronic pain of both shoulders-she had limited range of motion of her shoulders with abduction up to 90 degrees.  Patient declined x-rays of her shoulders.  Pain in both hands -she complains of pain and discomfort in the bilateral hands.  Bilateral synovial thickening and synovitis was noted.  Ulnar deviation was noted.  Plan: XR Hand 2 View Right, XR Hand 2 View Left, x-rays showed severe erosive rheumatoid arthritis with invagination of MCP joints and erosive changes in the metacarpals and intercarpal joints.  Ulnar styloid erosions were noted.  ANA  Pain in both feet -patient with no tenderness over ankles or MTPs.  She declined x-rays of her feet.   Other fatigue -she continues to have fatigue.  Plan: TSH, CK  Shortness of breath -patient states that shortness of breath has been there for many years which is progressively getting worse.  She states that Dr. Darlean looked at her old CT angiogram and was concerned that she may have ILD.  She does not recall having PFTs.  I will obtain high-resolution CT to look for ILD.  Plan: CT Chest High Resolution  Chronic obstructive pulmonary disease, unspecified COPD type (HCC)-patient has been a chronic smoker for more than 40 years.  She quits smoking cigarettes and is vaping now.  She has been under care of Dr. Darlean for a long time.  She states she is unable to walk due to shortness of breath.  She is not on oxygen.  Lumbar spondylosis-Ro several years ago by orthopedics per patient.  Renal  cell carcinoma of left kidney (HCC) - Partial left nephrectomy per patient.  Other medical problems listed as follows:  Prediabetes  Gastroesophageal reflux disease without esophagitis  Fibromyalgia-patient was diagnosed with fibromyalgia syndrome many years ago.  She continues to have generalized pain and hyperalgesia.  She states she has been to pain management in the past and did not get much relief.  Chronic pain syndrome-patient continues to have generalized pain and discomfort.  She wanted pain medication.  I offered referral to pain management which she declined.  Generalized anxiety disorder-she is on disability due to anxiety and depression.  She is followed by psychiatrist.  Smoker - She quit smoking cigarettes but vapes now.  Orders: Orders Placed This Encounter  Procedures   XR Hand 2 View Right   XR Hand 2 View Left   XR Foot 2 Views Right   XR Foot 2 Views Left   CT Chest High Resolution   CBC with Differential/Platelet   Comprehensive metabolic panel with GFR   TSH   CK   ANA   Hepatitis B core antibody, IgM   Hepatitis B surface antigen   Hepatitis C antibody   QuantiFERON-TB Gold Plus   Serum protein electrophoresis with reflex   IgG, IgA, IgM   Thiopurine methyltransferase(tpmt)rbc   No orders of the defined types were placed in this encounter.    Follow-Up Instructions: Return for Rheumatoid arthritis.   Maya Nash, MD  Note - This record has been created using Animal nutritionist.  Chart creation errors have been sought, but may not always  have been located.  Such creation errors do not reflect on  the standard of medical care.

## 2024-11-01 ENCOUNTER — Ambulatory Visit

## 2024-11-01 ENCOUNTER — Ambulatory Visit: Attending: Rheumatology | Admitting: Rheumatology

## 2024-11-01 ENCOUNTER — Encounter: Payer: Self-pay | Admitting: Rheumatology

## 2024-11-01 VITALS — BP 141/85 | HR 94 | Temp 97.5°F | Resp 18 | Ht 64.0 in | Wt 215.8 lb

## 2024-11-01 DIAGNOSIS — J849 Interstitial pulmonary disease, unspecified: Secondary | ICD-10-CM

## 2024-11-01 DIAGNOSIS — Z79899 Other long term (current) drug therapy: Secondary | ICD-10-CM | POA: Diagnosis not present

## 2024-11-01 DIAGNOSIS — F411 Generalized anxiety disorder: Secondary | ICD-10-CM

## 2024-11-01 DIAGNOSIS — R7303 Prediabetes: Secondary | ICD-10-CM | POA: Insufficient documentation

## 2024-11-01 DIAGNOSIS — C642 Malignant neoplasm of left kidney, except renal pelvis: Secondary | ICD-10-CM | POA: Diagnosis not present

## 2024-11-01 DIAGNOSIS — M79642 Pain in left hand: Secondary | ICD-10-CM

## 2024-11-01 DIAGNOSIS — K219 Gastro-esophageal reflux disease without esophagitis: Secondary | ICD-10-CM | POA: Insufficient documentation

## 2024-11-01 DIAGNOSIS — M79671 Pain in right foot: Secondary | ICD-10-CM | POA: Insufficient documentation

## 2024-11-01 DIAGNOSIS — M79641 Pain in right hand: Secondary | ICD-10-CM | POA: Insufficient documentation

## 2024-11-01 DIAGNOSIS — R5383 Other fatigue: Secondary | ICD-10-CM | POA: Diagnosis not present

## 2024-11-01 DIAGNOSIS — J449 Chronic obstructive pulmonary disease, unspecified: Secondary | ICD-10-CM | POA: Insufficient documentation

## 2024-11-01 DIAGNOSIS — R0602 Shortness of breath: Secondary | ICD-10-CM | POA: Insufficient documentation

## 2024-11-01 DIAGNOSIS — F17210 Nicotine dependence, cigarettes, uncomplicated: Secondary | ICD-10-CM

## 2024-11-01 DIAGNOSIS — G894 Chronic pain syndrome: Secondary | ICD-10-CM | POA: Insufficient documentation

## 2024-11-01 DIAGNOSIS — M47816 Spondylosis without myelopathy or radiculopathy, lumbar region: Secondary | ICD-10-CM

## 2024-11-01 DIAGNOSIS — M79672 Pain in left foot: Secondary | ICD-10-CM | POA: Insufficient documentation

## 2024-11-01 DIAGNOSIS — F1120 Opioid dependence, uncomplicated: Secondary | ICD-10-CM

## 2024-11-01 DIAGNOSIS — M25511 Pain in right shoulder: Secondary | ICD-10-CM | POA: Insufficient documentation

## 2024-11-01 DIAGNOSIS — M0579 Rheumatoid arthritis with rheumatoid factor of multiple sites without organ or systems involvement: Secondary | ICD-10-CM | POA: Insufficient documentation

## 2024-11-01 DIAGNOSIS — M797 Fibromyalgia: Secondary | ICD-10-CM

## 2024-11-01 DIAGNOSIS — G8929 Other chronic pain: Secondary | ICD-10-CM | POA: Insufficient documentation

## 2024-11-01 DIAGNOSIS — F17200 Nicotine dependence, unspecified, uncomplicated: Secondary | ICD-10-CM | POA: Insufficient documentation

## 2024-11-01 DIAGNOSIS — M25512 Pain in left shoulder: Secondary | ICD-10-CM | POA: Insufficient documentation

## 2024-11-01 NOTE — Patient Instructions (Signed)
Rheumatoid Arthritis Rheumatoid arthritis (RA) is a long-term (chronic) disease. RA causes inflammation in your joints. Your joints may feel painful, stiff, swollen, and warm. RA may start slowly. It most often affects the small joints of the hands and feet. It can also affect other parts of the body. Symptoms of RA often come and go. There is no cure for RA, but medicines can help your symptoms. What are the causes? RA is an autoimmune disease. This means that your body's defense system (immune system) attacks healthy parts of your body by mistake. The exact cause of RA is not known. What increases the risk? Being female. Having a family history of RA or other diseases like RA. Smoking. Being very overweight (obese). Being exposed to pollutants or chemicals. What are the signs or symptoms? Symptoms start slowly. They are often worse in the morning. The first symptom is often morning stiffness that lasts longer than 30 minutes. As RA gets worse, symptoms may include: Pain, stiffness, swelling, warmth, and tenderness in joints on both sides of your body. Loss of energy. Not wanting to eat as much as normal. Weight loss. A low fever. Dry eyes and a dry mouth. Firm lumps that grow under your skin. Changes in the way your joints look or the way they work. Symptoms vary and they often come and go. Symptoms sometimes get worse for a period of time. These are called flares. How is this treated? Treatment may include: Taking good care of yourself. Be sure to rest as needed, eat a healthy diet, and exercise. Medicines. These may include: Pain relievers. Medicines to help with inflammation. Disease-modifying antirheumatic drugs (DMARDs). Medicines called biologic response modifiers. Physical therapy and occupational therapy. Surgery, if joint damage is very bad. Your doctor will work with you to find the best treatments. Follow these instructions at home: Managing pain, stiffness, and  swelling If told, put heat on the affected area. Do this as often as told by your doctor. Use the heat source that your doctor recommends, such as a moist heat pack or a heating pad. Place a towel between your skin and the heat source. Leave the heat on for 20-30 minutes. Take off the heat if your skin turns bright red. This is very important. If you cannot feel pain, heat, or cold, you have a greater risk of getting burned.  Activity Return to your normal activities when your doctor says that it is safe. Rest when you have a flare. Exercise as told by your doctor. This can help your joints move better and get stronger. General instructions Take over-the-counter and prescription medicines only as told by your doctor. Keep all follow-up visits. Where to find more information [employer]: rheumatology.org Arthritis Foundation: arthritis.org Contact a doctor if: You have a flare. You have a fever. You have problems because of your medicines. Get help right away if: You have chest pain. You have trouble breathing. You get a hot, painful joint all of a sudden, and it is worse than your normal joint aches. These symptoms may be an emergency. Get help right away. Call 911. Do not wait to see if the symptoms will go away. Do not drive yourself to the hospital. Summary RA is a long-term disease. RA causes inflammation in your joints. Symptoms of RA start slowly. They are often worse in the morning. This information is not intended to replace advice given to you by your health care provider. Make sure you discuss any questions you have with  your health care provider. Document Revised: 09/10/2021 Document Reviewed: 09/10/2021 Elsevier Patient Education  2024 [employer].

## 2024-11-04 ENCOUNTER — Ambulatory Visit: Payer: Self-pay | Admitting: Rheumatology

## 2024-11-04 NOTE — Progress Notes (Signed)
 White cell count and lymphocyte count are mildly elevated. Glucose is elevated and sodium is low.  TSH is high which indicates hypothyroidism SPEP normal, ANA low titer positive, CK normal, hepatitis B and C nonreactive, immunoglobulins normal.  TB Gold and TPMT pending.  I will discuss results at the follow-up visit once we have high-resolution CT results available.  Please forward the lab results to her PCP.

## 2024-11-05 NOTE — Progress Notes (Signed)
 Some of the lab results are still pending.  Will have to schedule an earlier appointment to start her on the treatment of rheumatoid arthritis once the labs are available.

## 2024-11-07 ENCOUNTER — Telehealth: Payer: Self-pay | Admitting: Family Medicine

## 2024-11-07 NOTE — Telephone Encounter (Signed)
 Copied from CRM #[id number]. Topic: Clinical - Lab/Test Results >> Nov 07, 2024 11:01 AM Montie POUR wrote: Reason for CRM:  Elma would like for FNP Towana to call her to review the labs from [facility]. They have not started treatment of rheumatoid arthritis. Please call her at [phone] to discuss. She feels more comfortable speaking the FNP [facility].

## 2024-11-08 ENCOUNTER — Encounter: Payer: Self-pay | Admitting: Family Medicine

## 2024-11-08 LAB — COMPREHENSIVE METABOLIC PANEL WITH GFR
AG Ratio: 1.3 (calc) (ref 1.0–2.5)
ALT: 21 U/L (ref 6–29)
AST: 17 U/L (ref 10–35)
Albumin: 4.1 g/dL (ref 3.6–5.1)
Alkaline phosphatase (APISO): 99 U/L (ref 37–153)
BUN: 13 mg/dL (ref 7–25)
CO2: 20 mmol/L (ref 20–32)
Calcium: 9.1 mg/dL (ref 8.6–10.4)
Chloride: 101 mmol/L (ref 98–110)
Creat: 0.73 mg/dL (ref 0.50–1.05)
Globulin: 3.1 g/dL (ref 1.9–3.7)
Glucose, Bld: 136 mg/dL — ABNORMAL HIGH (ref 65–99)
Potassium: 5 mmol/L (ref 3.5–5.3)
Sodium: 133 mmol/L — ABNORMAL LOW (ref 135–146)
Total Bilirubin: 0.3 mg/dL (ref 0.2–1.2)
Total Protein: 7.2 g/dL (ref 6.1–8.1)
eGFR: 94 mL/min/1.73m2 (ref >=60)

## 2024-11-08 LAB — CBC WITH DIFFERENTIAL/PLATELET
Absolute Lymphocytes: 4876 {cells}/uL — ABNORMAL HIGH (ref 850–3900)
Absolute Monocytes: 794 {cells}/uL (ref 200–950)
Basophils Absolute: 69 {cells}/uL (ref 0–200)
Basophils Relative: 0.6 %
Eosinophils Absolute: 219 {cells}/uL (ref 15–500)
Eosinophils Relative: 1.9 %
HCT: 42.3 % (ref 35.9–46.0)
Hemoglobin: 14.1 g/dL (ref 11.7–15.5)
MCH: 27.5 pg (ref 27.0–33.0)
MCHC: 33.3 g/dL (ref 31.6–35.4)
MCV: 82.5 fL (ref 81.4–101.7)
MPV: 12.2 fL (ref 7.5–12.5)
Monocytes Relative: 6.9 %
Neutro Abs: 5543 {cells}/uL (ref 1500–7800)
Neutrophils Relative %: 48.2 %
Platelets: 358 Thousand/uL (ref 140–400)
RBC: 5.13 Million/uL — ABNORMAL HIGH (ref 3.80–5.10)
RDW: 14.3 % (ref 11.0–15.0)
Total Lymphocyte: 42.4 %
WBC: 11.5 Thousand/uL — ABNORMAL HIGH (ref 3.8–10.8)

## 2024-11-08 LAB — HEPATITIS B CORE ANTIBODY, IGM: Hep B C IgM: NONREACTIVE

## 2024-11-08 LAB — ANTI-NUCLEAR AB-TITER (ANA TITER): ANA Titer 1: 1:160 {titer} — ABNORMAL HIGH

## 2024-11-08 LAB — QUANTIFERON-TB GOLD PLUS
Mitogen-NIL: 8.09 [IU]/mL
NIL: 0.03 [IU]/mL
QuantiFERON-TB Gold Plus: NEGATIVE
TB1-NIL: 0.01 [IU]/mL
TB2-NIL: 0.01 [IU]/mL

## 2024-11-08 LAB — PROTEIN ELECTROPHORESIS, SERUM, WITH REFLEX
Albumin ELP: 4 g/dL (ref 3.8–4.8)
Alpha 1: 0.3 g/dL (ref 0.2–0.3)
Alpha 2: 1 g/dL — ABNORMAL HIGH (ref 0.5–0.9)
Beta 2: 0.5 g/dL (ref 0.2–0.5)
Beta Globulin: 0.6 g/dL (ref 0.4–0.6)
Gamma Globulin: 1 g/dL (ref 0.8–1.7)
Total Protein: 7.3 g/dL (ref 6.1–8.1)

## 2024-11-08 LAB — IGG, IGA, IGM
IgG (Immunoglobin G), Serum: 1132 mg/dL (ref 600–1640)
IgM, Serum: 136 mg/dL (ref 50–300)
Immunoglobulin A: 210 mg/dL (ref 47–310)

## 2024-11-08 LAB — CK: Total CK: 25 U/L (ref 20–243)

## 2024-11-08 LAB — THIOPURINE METHYLTRANSFERASE (TPMT), RBC: Thiopurine Methyltransferase, RBC: 18 nmol/h/mL

## 2024-11-08 LAB — HEPATITIS B SURFACE ANTIGEN: Hepatitis B Surface Ag: NONREACTIVE

## 2024-11-08 LAB — ANA: Anti Nuclear Antibody (ANA): POSITIVE — AB

## 2024-11-08 LAB — HEPATITIS C ANTIBODY: Hepatitis C Ab: NONREACTIVE

## 2024-11-08 LAB — TSH: TSH: 6.59 m[IU]/L — ABNORMAL HIGH (ref 0.40–4.50)

## 2024-11-08 NOTE — Progress Notes (Signed)
 TPMT normal, TB Gold negative.

## 2024-11-09 NOTE — Telephone Encounter (Signed)
 Patient called in again to ask about this medication being sent to pharmacy for her. She would like to know if another provider in office could fill it for her. She stated that she has one pill left for tomorrow and that's it.

## 2024-11-09 NOTE — Progress Notes (Deleted)
 "  Office Visit Note  Patient: Veronica Rangel             Date of Birth: 1966-04-16           MRN: [id number]             PCP: Towana Small, FNP Referring: Towana Small, FNP Visit Date: 11/13/2024 Occupation: Data Unavailable  Subjective:  No chief complaint on file.   History of Present Illness: Veronica Rangel is a 58 y.o. female ***     Activities of Daily Living:  Patient reports morning stiffness for *** {minute/hour:19697}.   Patient {ACTIONS;DENIES/REPORTS:21021675::Denies} nocturnal pain.  Difficulty dressing/grooming: {ACTIONS;DENIES/REPORTS:21021675::Denies} Difficulty climbing stairs: {ACTIONS;DENIES/REPORTS:21021675::Denies} Difficulty getting out of chair: {ACTIONS;DENIES/REPORTS:21021675::Denies} Difficulty using hands for taps, buttons, cutlery, and/or writing: {ACTIONS;DENIES/REPORTS:21021675::Denies}  No Rheumatology ROS completed.   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   Incoordination 01/24/2024   Low back pain 01/24/2024   Panic anxiety syndrome 01/24/2024   Chronic obstructive pulmonary disease (HCC) 01/24/2024   Elevated blood pressure reading in office without diagnosis of hypertension 10/12/2023   Prediabetes 03/10/2023   Gastroesophageal reflux disease 03/10/2023   Shortness of breath 05/27/2022   Depression, major, single episode, moderate (HCC) 05/07/2022   Fibromyalgia 05/07/2022   Rheumatoid arthritis involving both hands (HCC) 05/07/2022   History of kidney  cancer 05/07/2022   Tobacco use 05/07/2022   Anxiety disorder 05/05/2022   COPD 0/ active smoker with ? 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   Pain in both feet 06/15/2017   Encounter for screening for respiratory tuberculosis 06/15/2017   Raised antibody titer 06/15/2017   Epigastric pain 12/25/2013   Chronic narcotic dependence (HCC) 12/25/2013   Renal cell carcinoma (HCC) 12/25/2013   Shoulder pain 06/09/2013   Depression 10/26/2012   Loss of appetite 10/26/2012    Past Medical History:  Diagnosis Date   Anxiety    Cancer (HCC)    renal   COPD (chronic obstructive pulmonary disease) (HCC)    Depression    Encounter for screening for respiratory tuberculosis 06/15/2017   Fibromyalgia    GERD (gastroesophageal reflux disease)    Hypertension    RA (rheumatoid arthritis) (HCC)    Shingles     Family History  Problem Relation Age of Onset   Thyroid  disease Mother    Rheum arthritis Mother    Cancer Father        lung   Heart disease Maternal Grandfather    Diabetes Maternal Grandfather    Healthy Son    Healthy Son    Past Surgical History:  Procedure Laterality Date   ADRENALECTOMY Left    APPENDECTOMY     CESAREAN SECTION     CHOLECYSTECTOMY     NASAL SINUS SURGERY     x2   NEPHRECTOMY RADICAL     TUBAL LIGATION     Social History[1] Social History   Social  History Narrative   Not on file     Immunization History  Administered Date(s) Administered   Td 05/08/2019     Objective: Vital Signs: There were no vitals taken for this visit.   Physical Exam   Musculoskeletal Exam: ***  CDAI Exam: CDAI Score: -- Patient Global: --; Provider Global: -- Swollen: --; Tender: -- Joint Exam 11/13/2024   No joint exam has been documented for this visit   There is currently no  information documented on the homunculus. Go to the Rheumatology activity and complete the homunculus joint exam.  Investigation: No additional findings.  Imaging: XR Hand 2 View Left Result Date: 11/01/2024 Generalized and juxta-articular osteopenia was noted.  Narrowing of all MCP joints with invagination of 2nd and 5th MCP joints was noted.  Severe metacarpal carpal, intercarpal, radiocarpal joint space narrowing was noted.  PIP and DIP narrowing was noted.  Erosive changes were noted in the carpal bones and over the ulnar styloid. Impression: These findings are suggestive of severe erosive rheumatoid arthritis  XR Hand 2 View Right Result Date: 11/01/2024 Generalized osteopenia was noted.  Severe MCP narrowing with invagination and subluxation was noted.  Erosive changes were noted over most of the metacarpal heads.  PIP and DIP narrowing was noted.  CMC narrowing was noted.  Metacarpal carpal , intercarpal joint space and radiocarpal joint space narrowing was noted.  Erosive changes were noted in the carpal bones.  Ulnar styloid erosion was noted. Impression: These findings is history of severe erosive rheumatoid arthritis.   Recent Labs: Lab Results  Component Value Date   WBC 11.5 (H) 11/01/2024   HGB 14.1 11/01/2024   PLT 358 11/01/2024   NA 133 (L) 11/01/2024   K 5.0 11/01/2024   CL 101 11/01/2024   CO2 20 11/01/2024   GLUCOSE 136 (H) 11/01/2024   BUN 13 11/01/2024   CREATININE 0.73 11/01/2024   BILITOT 0.3 11/01/2024   ALKPHOS 113 06/12/2024   AST 17 11/01/2024   ALT 21 11/01/2024   PROT 7.2 11/01/2024   PROT 7.3 11/01/2024   ALBUMIN 4.1 06/12/2024   CALCIUM  9.1 11/01/2024   QFTBGOLDPLUS NEGATIVE 11/01/2024   November 01, 2024 SPEP normal, immunoglobulins normal, TB Gold negative, ANA 1: 160 NH, TSH 6.59, CK 25, hepatitis B and C nonreactive, TPMT normal Speciality Comments: No specialty comments available.  Procedures:  No procedures performed Allergies: Codeine,  Amoxicillin-pot clavulanate, and Prednisone    Assessment / Plan:     Visit Diagnoses: No diagnosis found.  Orders: No orders of the defined types were placed in this encounter.  No orders of the defined types were placed in this encounter.   Face-to-face time spent with patient was *** minutes. Greater than 50% of time was spent in counseling and coordination of care.  Follow-Up Instructions: No follow-ups on file.   Maya Nash, MD  Note - This record has been created using Animal nutritionist.  Chart creation errors have been sought, but may not always  have been located. Such creation errors do not reflect on  the standard of medical care.    [1]  Social History Tobacco Use   Smoking status: Former    Types: Cigarettes    Passive exposure: Current   Smokeless tobacco: Never   Tobacco comments:    Reports she quit cigarette smoking about 04/11/2023    States she does participate in vaping   Vaping Use   Vaping status: Some Days  Substance Use Topics   Alcohol use:  Never   Drug use: Never   "

## 2024-11-12 ENCOUNTER — Other Ambulatory Visit: Payer: Self-pay | Admitting: Family Medicine

## 2024-11-12 DIAGNOSIS — F411 Generalized anxiety disorder: Secondary | ICD-10-CM

## 2024-11-12 MED ORDER — LORAZEPAM 0.5 MG PO TABS: 0.5000 mg | ORAL_TABLET | Freq: Two times a day (BID) | ORAL | 0 refills | Status: DC | PRN

## 2024-11-12 NOTE — Telephone Encounter (Signed)
 New mychart sent by pt. Routing to [place] for review.

## 2024-11-13 ENCOUNTER — Ambulatory Visit: Attending: Rheumatology | Admitting: Rheumatology

## 2024-11-16 ENCOUNTER — Encounter: Payer: Self-pay | Admitting: Family Medicine

## 2024-11-26 ENCOUNTER — Encounter: Admitting: Rheumatology

## 2024-11-30 ENCOUNTER — Other Ambulatory Visit: Payer: Self-pay | Admitting: Family Medicine

## 2024-11-30 DIAGNOSIS — M62838 Other muscle spasm: Secondary | ICD-10-CM

## 2024-11-30 MED ORDER — TIZANIDINE HCL 2 MG PO TABS: 2.0000 mg | ORAL_TABLET | Freq: Four times a day (QID) | ORAL | 2 refills | Status: DC | PRN

## 2024-12-06 ENCOUNTER — Ambulatory Visit: Admitting: Family Medicine

## 2024-12-10 ENCOUNTER — Ambulatory Visit: Admitting: Family Medicine

## 2024-12-12 ENCOUNTER — Telehealth: Payer: Self-pay | Admitting: Family Medicine

## 2024-12-12 ENCOUNTER — Telehealth: Admitting: Family Medicine

## 2024-12-12 ENCOUNTER — Ambulatory Visit: Admitting: Family Medicine

## 2024-12-12 VITALS — BP 123/66 | HR 84

## 2024-12-12 DIAGNOSIS — M05742 Rheumatoid arthritis with rheumatoid factor of left hand without organ or systems involvement: Secondary | ICD-10-CM | POA: Diagnosis not present

## 2024-12-12 DIAGNOSIS — F410 Panic disorder [episodic paroxysmal anxiety] without agoraphobia: Secondary | ICD-10-CM

## 2024-12-12 DIAGNOSIS — R269 Unspecified abnormalities of gait and mobility: Secondary | ICD-10-CM | POA: Diagnosis not present

## 2024-12-12 DIAGNOSIS — M797 Fibromyalgia: Secondary | ICD-10-CM | POA: Diagnosis not present

## 2024-12-12 DIAGNOSIS — E038 Other specified hypothyroidism: Secondary | ICD-10-CM | POA: Diagnosis not present

## 2024-12-12 DIAGNOSIS — M05741 Rheumatoid arthritis with rheumatoid factor of right hand without organ or systems involvement: Secondary | ICD-10-CM | POA: Diagnosis not present

## 2024-12-12 DIAGNOSIS — J849 Interstitial pulmonary disease, unspecified: Secondary | ICD-10-CM | POA: Diagnosis not present

## 2024-12-12 MED ORDER — CYCLOBENZAPRINE HCL 5 MG PO TABS: 5.0000 mg | ORAL_TABLET | Freq: Two times a day (BID) | ORAL | 1 refills | Status: AC | PRN

## 2024-12-12 MED ORDER — LORAZEPAM 0.5 MG PO TABS: 0.5000 mg | ORAL_TABLET | Freq: Two times a day (BID) | ORAL | 0 refills | Status: AC | PRN

## 2024-12-12 NOTE — Progress Notes (Signed)
 "  Virtual Visit via Video Note  I connected with Veronica Rangel on 12/12/24 at 1:41PM by a video enabled telemedicine application and verified that I am speaking with the correct person using two identifiers.  Patient Location: Home Provider Location: Office/Clinic  I discussed the limitations, risks, security, and privacy concerns of performing an evaluation and management service by video and the availability of in person appointments. I also discussed with the patient that there may be a patient responsible charge related to this service. The patient expressed understanding and agreed to proceed.  Subjective: PCP: Towana Small, FNP  Chief Complaint  Patient presents with   Telehealth Consent   Numbness/tingling all over  Discussed the use of AI scribe software for clinical note transcription with the patient, who gave verbal consent to proceed.  History of Present Illness   Veronica Rangel is a 59 year old female with fibromyalgia and rheumatoid arthritis who presents with chronic pain and weakness.  She experiences chronic pain primarily in her right shoulder blade and back, described as a tingling, prickling sensation. The pain is temporarily relieved by massage and an OTC pain patch but returns shortly after. She has been largely bedridden for the past year, leading to significant muscle weakness and difficulty functioning.  She has taken various medications for her symptoms, including tizanidine , methocarbamol  (Robaxin ), and baclofen . Robaxin  helps with pain, while tizanidine  helps with muscle spasms. She is currently taking tizanidine  at this time. She also uses Ativan  for anxiety every twelve hours and experiences irritability before her next dose is due. She reports that she is cautious about medication use due to fear of side effects and potential interactions.   She has a history of fibromyalgia and was diagnosed with possible ILD. She experiences shortness of breath. She has not  seen a pulmonologist in about ten months. Last saw Dr. Darlean 02/08/2024.   She reports significant muscle spasms and weakness, particularly in her upper body, which she attributes to fibromyalgia and possibly high inflammation levels. Her blood work has shown abnormal results, and she experiences night sweats, which her mother associates with thyroid  issues. Her thyroid  levels have been abnormal recently.  She experiences anxiety and panic, particularly related to her health conditions and her husband's inability to drive due to cataracts. She has a history of OCD and is concerned about her declining health. She also reports low blood pressure readings, which she attributes to dehydration, and notes that drinking Powerade seems to help stabilize her blood pressure.  ROS: Per HPI  Current Medications[1]  Observations/Objective: Today's Vitals   12/12/24 1345  BP: 123/66  Pulse: 84  SpO2: 97%   General: Alert and oriented x 4. Speaking in clear and full sentences, no audible heavy breathing, no acute distress.  Sounds alert and appropriately interactive.  Appears well.  Face symmetric.  Extraocular movements intact.  Pupils equal and round.  No nasal flaring or accessory muscle use visualized.  Assessment and Plan:  1. Fibromyalgia (Primary) Chronic pain and muscle spasms, particularly in the right shoulder blade and back. Flexeril  previously effective. Prescribed Flexeril  twice daily. Advised patient to not take close to Ativan  due to possibility of central nervous system depression, which could lead to serious harm and even death. Patient verbalized an understanding and states that she will stop taking all other muscle relaxers. Patient would benefit from transition from Effexor  to Cymbalta ; however, she is still hesitant about changing medications and declines at this time. Patient would benefit from home health  services due to difficulty leaving the house, decrease in mobility and strength, and  abnormal gait due to chronic pain.  - Ambulatory referral to Home Health - cyclobenzaprine  (FLEXERIL ) 5 MG tablet; Take 1 tablet (5 mg total) by mouth 2 (two) times daily as needed for muscle spasms.  Dispense: 30 tablet; Refill: 1  2. Rheumatoid arthritis involving both hands with positive rheumatoid factor (HCC) Significant hand function deterioration with high inflammation levels causing pain and spasms. Referred to home health for physical and occupational therapy. Discussed with patient the importance of scheduling CT scan of her chest for assessment of possible ILD and to ensure she attends her follow-up with rheumatologist for medication management. - Ambulatory referral to Home Health  3. Gait abnormality Severe deconditioning and weakness from prolonged bed rest. Difficulty walking and performing daily activities. Referred to home health for physical therapy. - Ambulatory referral to Home Health  4. Panic anxiety syndrome Increased anxiety and panic symptoms, possibly due to medication changes and health fears. Current Ativan  regimen may be insufficient, but patient agreeable to continue Ativan  0.5mg  Q12 hours PRN. PDMP reviewed, no red flags present. Prescribed Ativan  refill. Discussed potential use of Cymbalta  for chronic pain and anxiety management- however, patient is hesitant to medication changes.  - LORazepam  (ATIVAN ) 0.5 MG tablet; Take 1 tablet (0.5 mg total) by mouth every 12 (twelve) hours as needed for anxiety.  Dispense: 60 tablet; Refill: 0  5. ILD (interstitial lung disease) (HCC) Suspected interstitial lung disease due to complications from untreated RA. CT scan done 01/15/2024 showing findings may be related to chronic interstitial lung disease. Patient reports chronic shortness of breath and reports this worsens when she thinks about the possibility of lung disease progression. Oxygen saturation (on room air) at home is within normal limits. Awaiting high-resolution CT  scan. Schedule high-resolution chest CT scan before rheumatologist appointment. Ensure follow-up with rheumatologist for treatment adjustments based on CT findings.  6. Subclinical hypothyroidism Recent TSH abnormal at 6.59. Patient reports symptoms of weight gain and fatigue may relate to thyroid  dysfunction. Discussed with patient to follow-up with rheumatology or come to office to have thyroid  function repeated. Patient will discuss potential initiation of Synthroid with rheumatologist.     Follow Up Instructions: Return in about 4 weeks (around 01/09/2025) for pain & anxiety management .   I discussed the assessment and treatment plan with the patient. The patient was provided an opportunity to ask questions, and all were answered. The patient agreed with the plan and demonstrated an understanding of the instructions.   The patient was advised to call back or seek an in-person evaluation if the symptoms worsen or if the condition fails to improve as anticipated.  The above assessment and management plan was discussed with the patient. The patient verbalized understanding of and has agreed to the management plan.   Spent 45 minutes on this patient encounter, including preparation, chart review, face-to-face counseling with patient and coordination of care, and documentation of encounter.    Evalene Arts, FNP     [1]  Current Outpatient Medications:    albuterol  (VENTOLIN  HFA) 108 (90 Base) MCG/ACT inhaler, Inhale 2 puffs into the lungs every 6 (six) hours as needed for wheezing or shortness of breath., Disp: 8 g, Rfl: 3   cyclobenzaprine  (FLEXERIL ) 5 MG tablet, Take 1 tablet (5 mg total) by mouth 2 (two) times daily as needed for muscle spasms., Disp: 30 tablet, Rfl: 1   ipratropium-albuterol  (DUONEB) 0.5-2.5 (3) MG/3ML SOLN, Take  3 mLs by nebulization every 4 (four) hours as needed., Disp: 90 mL, Rfl: 5   omeprazole (PRILOSEC) 40 MG capsule, Take 40 mg by mouth 2 (two) times daily.,  Disp: , Rfl:    propranolol  (INDERAL ) 10 MG tablet, Take 1 tablet (10 mg total) by mouth 2 (two) times daily., Disp: 60 tablet, Rfl: 2   venlafaxine  XR (EFFEXOR  XR) 75 MG 24 hr capsule, Take 1 capsule (75mg ) daily, along with the 150mg  capsule for a total of 225mg  daily., Disp: 90 capsule, Rfl: 3   venlafaxine  XR (EFFEXOR -XR) 150 MG 24 hr capsule, Take 1 capsule (150 mg total) by mouth daily with breakfast. Take with venlafaxine  75mg  to equal venlafaxine  225mg  daily, Disp: 90 capsule, Rfl: 3   budesonide -formoterol  (SYMBICORT ) 80-4.5 MCG/ACT inhaler, Inhale 2 puffs into the lungs 2 (two) times daily. (Patient not taking: Reported on 12/12/2024), Disp: 6.9 g, Rfl: 2   LORazepam  (ATIVAN ) 0.5 MG tablet, Take 1 tablet (0.5 mg total) by mouth every 12 (twelve) hours as needed for anxiety., Disp: 60 tablet, Rfl: 0  "

## 2024-12-12 NOTE — Telephone Encounter (Signed)
 Called and sent a Mychart message asking patient to please call and scheduled an appointment for 4 weeks per her provider.

## 2024-12-17 NOTE — Progress Notes (Unsigned)
 "  Office Visit Note  Patient: Veronica Rangel             Date of Birth: 06/04/1966           MRN: [id number]             PCP: Towana Small, FNP Referring: Towana Small, FNP Visit Date: 12/27/2024 Occupation: Data Unavailable  Subjective:  No chief complaint on file.   History of Present Illness: Veronica Rangel is a 59 y.o. female ***     Activities of Daily Living:  Patient reports morning stiffness for *** {minute/hour:19697}.   Patient {ACTIONS;DENIES/REPORTS:21021675::Denies} nocturnal pain.  Difficulty dressing/grooming: {ACTIONS;DENIES/REPORTS:21021675::Denies} Difficulty climbing stairs: {ACTIONS;DENIES/REPORTS:21021675::Denies} Difficulty getting out of chair: {ACTIONS;DENIES/REPORTS:21021675::Denies} Difficulty using hands for taps, buttons, cutlery, and/or writing: {ACTIONS;DENIES/REPORTS:21021675::Denies}  No Rheumatology ROS completed.   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   Incoordination 01/24/2024   Low back pain 01/24/2024   Panic anxiety syndrome 01/24/2024   Chronic obstructive pulmonary disease (HCC) 01/24/2024   Elevated blood pressure reading in office without diagnosis of hypertension 10/12/2023   Prediabetes 03/10/2023   Gastroesophageal reflux disease 03/10/2023   Shortness of breath 05/27/2022   Depression, major, single episode, moderate (HCC) 05/07/2022   Fibromyalgia 05/07/2022   Rheumatoid  arthritis involving both hands (HCC) 05/07/2022   History of kidney cancer 05/07/2022   Tobacco use 05/07/2022   Anxiety disorder 05/05/2022   COPD 0/ active smoker with ? 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   Pain in both feet 06/15/2017   Encounter for screening for respiratory tuberculosis 06/15/2017   Raised antibody titer 06/15/2017   Epigastric pain 12/25/2013   Chronic narcotic dependence (HCC) 12/25/2013   Renal cell carcinoma (HCC) 12/25/2013   Shoulder pain 06/09/2013   Depression 10/26/2012   Loss of appetite 10/26/2012    Past Medical History:  Diagnosis Date   Anxiety    Cancer (HCC)    renal   COPD (chronic obstructive pulmonary disease) (HCC)    Depression    Encounter for screening for respiratory tuberculosis 06/15/2017   Fibromyalgia    GERD (gastroesophageal reflux disease)    Hypertension    RA (rheumatoid arthritis) (HCC)    Shingles     Family History  Problem Relation Age of Onset   Thyroid  disease Mother    Rheum arthritis Mother    Cancer Father        lung   Heart disease Maternal Grandfather    Diabetes Maternal Grandfather    Healthy Son    Healthy Son    Past Surgical History:  Procedure Laterality Date   ADRENALECTOMY Left    APPENDECTOMY     CESAREAN SECTION     CHOLECYSTECTOMY     NASAL SINUS SURGERY     x2   NEPHRECTOMY RADICAL     TUBAL LIGATION  Social History[1] Social History   Social History Narrative   Not on file     Immunization History  Administered Date(s) Administered   Td 05/08/2019     Objective: Vital Signs: There were no vitals taken for this visit.   Physical Exam   Musculoskeletal Exam: ***  CDAI Exam: CDAI Score: -- Patient Global: --; Provider Global: -- Swollen: --; Tender: -- Joint Exam 12/27/2024   No joint  exam has been documented for this visit   There is currently no information documented on the homunculus. Go to the Rheumatology activity and complete the homunculus joint exam.  Investigation: No additional findings.  Imaging: No results found.  Recent Labs: Lab Results  Component Value Date   WBC 11.5 (H) 11/01/2024   HGB 14.1 11/01/2024   PLT 358 11/01/2024   NA 133 (L) 11/01/2024   K 5.0 11/01/2024   CL 101 11/01/2024   CO2 20 11/01/2024   GLUCOSE 136 (H) 11/01/2024   BUN 13 11/01/2024   CREATININE 0.73 11/01/2024   BILITOT 0.3 11/01/2024   ALKPHOS 113 06/12/2024   AST 17 11/01/2024   ALT 21 11/01/2024   PROT 7.2 11/01/2024   PROT 7.3 11/01/2024   ALBUMIN 4.1 06/12/2024   CALCIUM  9.1 11/01/2024   QFTBGOLDPLUS NEGATIVE 11/01/2024   November 01, 2024 ANA 1: 160 NH, SPEP normal, TB Gold negative, immunoglobulins normal, TSH 6.59, CK normal, hepatitis B nonreactive, hepatitis C nonreactive, TPMT normal   June 12, 2024 ANA negative, RF 426.7, anti-CCP> 250, sed rate 63, CRP 14, hemoglobin A1c 5.9  Speciality Comments: No specialty comments available.  Procedures:  No procedures performed Allergies: Codeine, Amoxicillin-pot clavulanate, and Prednisone    Assessment / Plan:     Visit Diagnoses: No diagnosis found.  Orders: No orders of the defined types were placed in this encounter.  No orders of the defined types were placed in this encounter.   Face-to-face time spent with patient was *** minutes. Greater than 50% of time was spent in counseling and coordination of care.  Follow-Up Instructions: No follow-ups on file.   Maya Nash, MD  Note - This record has been created using Animal nutritionist.  Chart creation errors have been sought, but may not always  have been located. Such creation errors do not reflect on  the standard of medical care.    [1]  Social History Tobacco Use   Smoking status: Former    Types: Cigarettes    Passive exposure:  Current   Smokeless tobacco: Never   Tobacco comments:    Reports she quit cigarette smoking about 04/11/2023    States she does participate in vaping   Vaping Use   Vaping status: Some Days  Substance Use Topics   Alcohol use: Never   Drug use: Never   "

## 2024-12-18 NOTE — Telephone Encounter (Signed)
 No additional note

## 2024-12-27 ENCOUNTER — Ambulatory Visit: Admitting: Rheumatology

## 2024-12-27 DIAGNOSIS — Z79899 Other long term (current) drug therapy: Secondary | ICD-10-CM

## 2024-12-27 DIAGNOSIS — J449 Chronic obstructive pulmonary disease, unspecified: Secondary | ICD-10-CM

## 2024-12-27 DIAGNOSIS — M79641 Pain in right hand: Secondary | ICD-10-CM

## 2024-12-27 DIAGNOSIS — R5383 Other fatigue: Secondary | ICD-10-CM

## 2024-12-27 DIAGNOSIS — M79671 Pain in right foot: Secondary | ICD-10-CM

## 2024-12-27 DIAGNOSIS — F17200 Nicotine dependence, unspecified, uncomplicated: Secondary | ICD-10-CM

## 2024-12-27 DIAGNOSIS — R7303 Prediabetes: Secondary | ICD-10-CM

## 2024-12-27 DIAGNOSIS — M0579 Rheumatoid arthritis with rheumatoid factor of multiple sites without organ or systems involvement: Secondary | ICD-10-CM

## 2024-12-27 DIAGNOSIS — C642 Malignant neoplasm of left kidney, except renal pelvis: Secondary | ICD-10-CM

## 2024-12-27 DIAGNOSIS — F411 Generalized anxiety disorder: Secondary | ICD-10-CM

## 2024-12-27 DIAGNOSIS — K219 Gastro-esophageal reflux disease without esophagitis: Secondary | ICD-10-CM

## 2024-12-27 DIAGNOSIS — G894 Chronic pain syndrome: Secondary | ICD-10-CM

## 2024-12-27 DIAGNOSIS — R0602 Shortness of breath: Secondary | ICD-10-CM

## 2024-12-27 DIAGNOSIS — M47816 Spondylosis without myelopathy or radiculopathy, lumbar region: Secondary | ICD-10-CM

## 2024-12-27 DIAGNOSIS — M797 Fibromyalgia: Secondary | ICD-10-CM

## 2024-12-27 DIAGNOSIS — G8929 Other chronic pain: Secondary | ICD-10-CM

## 2025-01-22 ENCOUNTER — Ambulatory Visit: Admitting: Rheumatology
